# Patient Record
Sex: Male | Born: 1955 | Race: White | Hispanic: No | Smoking: Never smoker
Health system: Southern US, Community
[De-identification: ages and names within clinical notes are randomized; demographics above are authoritative.]

## PROBLEM LIST (undated history)

## (undated) DIAGNOSIS — K2270 Barrett's esophagus without dysplasia: Secondary | ICD-10-CM

## (undated) DIAGNOSIS — G4730 Sleep apnea, unspecified: Secondary | ICD-10-CM

## (undated) DIAGNOSIS — C439 Malignant melanoma of skin, unspecified: Secondary | ICD-10-CM

## (undated) DIAGNOSIS — I4891 Unspecified atrial fibrillation: Secondary | ICD-10-CM

## (undated) DIAGNOSIS — I10 Essential (primary) hypertension: Secondary | ICD-10-CM

## (undated) DIAGNOSIS — I509 Heart failure, unspecified: Secondary | ICD-10-CM

## (undated) HISTORY — PX: OTHER SURGICAL HISTORY: SHX169

## (undated) HISTORY — PX: BACK SURGERY: SHX140

## (undated) HISTORY — PX: APPENDECTOMY: SHX54

---

## 1998-09-25 ENCOUNTER — Ambulatory Visit: Admission: RE | Admit: 1998-09-25 | Discharge: 1998-09-25 | Payer: Self-pay | Admitting: *Deleted

## 1998-09-25 ENCOUNTER — Encounter: Payer: Self-pay | Admitting: *Deleted

## 2001-11-08 ENCOUNTER — Encounter: Admission: RE | Admit: 2001-11-08 | Discharge: 2001-12-20 | Payer: Self-pay | Admitting: Occupational Medicine

## 2004-06-01 ENCOUNTER — Ambulatory Visit (HOSPITAL_BASED_OUTPATIENT_CLINIC_OR_DEPARTMENT_OTHER): Admission: RE | Admit: 2004-06-01 | Discharge: 2004-06-01 | Payer: Self-pay | Admitting: Cardiology

## 2004-06-12 ENCOUNTER — Ambulatory Visit (HOSPITAL_BASED_OUTPATIENT_CLINIC_OR_DEPARTMENT_OTHER): Admission: RE | Admit: 2004-06-12 | Discharge: 2004-06-12 | Payer: Self-pay | Admitting: Cardiology

## 2006-06-18 ENCOUNTER — Emergency Department (HOSPITAL_COMMUNITY): Admission: EM | Admit: 2006-06-18 | Discharge: 2006-06-18 | Payer: Self-pay | Admitting: Emergency Medicine

## 2006-09-26 ENCOUNTER — Emergency Department (HOSPITAL_COMMUNITY): Admission: EM | Admit: 2006-09-26 | Discharge: 2006-09-27 | Payer: Self-pay | Admitting: Emergency Medicine

## 2006-12-28 ENCOUNTER — Ambulatory Visit (HOSPITAL_COMMUNITY): Admission: RE | Admit: 2006-12-28 | Discharge: 2006-12-28 | Payer: Self-pay | Admitting: Cardiology

## 2007-11-17 ENCOUNTER — Ambulatory Visit: Payer: Self-pay | Admitting: Internal Medicine

## 2007-11-25 ENCOUNTER — Encounter: Payer: Self-pay | Admitting: Internal Medicine

## 2007-11-25 ENCOUNTER — Other Ambulatory Visit: Payer: Self-pay | Admitting: Internal Medicine

## 2007-11-25 ENCOUNTER — Ambulatory Visit: Payer: Self-pay | Admitting: Internal Medicine

## 2007-11-25 ENCOUNTER — Inpatient Hospital Stay (HOSPITAL_COMMUNITY): Admission: AD | Admit: 2007-11-25 | Discharge: 2007-11-26 | Payer: Self-pay | Admitting: Internal Medicine

## 2007-12-15 ENCOUNTER — Ambulatory Visit: Payer: Self-pay | Admitting: Cardiology

## 2008-01-12 ENCOUNTER — Ambulatory Visit (HOSPITAL_COMMUNITY): Admission: RE | Admit: 2008-01-12 | Discharge: 2008-01-13 | Payer: Self-pay | Admitting: Orthopedic Surgery

## 2008-01-29 ENCOUNTER — Inpatient Hospital Stay (HOSPITAL_COMMUNITY): Admission: EM | Admit: 2008-01-29 | Discharge: 2008-01-31 | Payer: Self-pay | Admitting: Emergency Medicine

## 2008-03-21 ENCOUNTER — Ambulatory Visit (HOSPITAL_COMMUNITY): Admission: RE | Admit: 2008-03-21 | Discharge: 2008-03-22 | Payer: Self-pay | Admitting: Orthopedic Surgery

## 2008-08-06 DIAGNOSIS — H9190 Unspecified hearing loss, unspecified ear: Secondary | ICD-10-CM | POA: Insufficient documentation

## 2008-08-06 DIAGNOSIS — I10 Essential (primary) hypertension: Secondary | ICD-10-CM | POA: Insufficient documentation

## 2008-08-06 DIAGNOSIS — I4892 Unspecified atrial flutter: Secondary | ICD-10-CM

## 2008-08-06 DIAGNOSIS — I2510 Atherosclerotic heart disease of native coronary artery without angina pectoris: Secondary | ICD-10-CM | POA: Insufficient documentation

## 2008-08-06 DIAGNOSIS — M109 Gout, unspecified: Secondary | ICD-10-CM | POA: Insufficient documentation

## 2008-08-06 DIAGNOSIS — I5032 Chronic diastolic (congestive) heart failure: Secondary | ICD-10-CM | POA: Insufficient documentation

## 2008-08-06 DIAGNOSIS — G4730 Sleep apnea, unspecified: Secondary | ICD-10-CM

## 2008-08-06 DIAGNOSIS — K219 Gastro-esophageal reflux disease without esophagitis: Secondary | ICD-10-CM | POA: Insufficient documentation

## 2008-08-06 DIAGNOSIS — G4733 Obstructive sleep apnea (adult) (pediatric): Secondary | ICD-10-CM | POA: Insufficient documentation

## 2008-09-19 ENCOUNTER — Ambulatory Visit: Payer: Self-pay | Admitting: Internal Medicine

## 2008-09-19 ENCOUNTER — Inpatient Hospital Stay (HOSPITAL_COMMUNITY): Admission: EM | Admit: 2008-09-19 | Discharge: 2008-09-24 | Payer: Self-pay | Admitting: Emergency Medicine

## 2008-09-24 ENCOUNTER — Encounter: Payer: Self-pay | Admitting: Internal Medicine

## 2009-02-27 ENCOUNTER — Telehealth (INDEPENDENT_AMBULATORY_CARE_PROVIDER_SITE_OTHER): Payer: Self-pay | Admitting: *Deleted

## 2009-03-22 ENCOUNTER — Emergency Department (HOSPITAL_COMMUNITY): Admission: EM | Admit: 2009-03-22 | Discharge: 2009-03-23 | Payer: Self-pay | Admitting: Emergency Medicine

## 2009-05-23 ENCOUNTER — Telehealth (INDEPENDENT_AMBULATORY_CARE_PROVIDER_SITE_OTHER): Payer: Self-pay | Admitting: *Deleted

## 2009-08-05 ENCOUNTER — Encounter: Payer: Self-pay | Admitting: Surgery

## 2009-08-05 ENCOUNTER — Ambulatory Visit (HOSPITAL_COMMUNITY): Admission: RE | Admit: 2009-08-05 | Discharge: 2009-08-05 | Payer: Self-pay | Admitting: Surgery

## 2009-11-05 ENCOUNTER — Inpatient Hospital Stay (HOSPITAL_COMMUNITY): Admission: EM | Admit: 2009-11-05 | Discharge: 2009-11-12 | Payer: Self-pay | Admitting: Emergency Medicine

## 2009-11-05 ENCOUNTER — Ambulatory Visit: Payer: Self-pay | Admitting: Internal Medicine

## 2009-11-11 ENCOUNTER — Encounter: Payer: Self-pay | Admitting: Internal Medicine

## 2009-11-14 ENCOUNTER — Telehealth: Payer: Self-pay | Admitting: Internal Medicine

## 2009-11-14 ENCOUNTER — Telehealth (INDEPENDENT_AMBULATORY_CARE_PROVIDER_SITE_OTHER): Payer: Self-pay | Admitting: Cardiology

## 2009-11-25 ENCOUNTER — Encounter: Admission: RE | Admit: 2009-11-25 | Discharge: 2009-11-25 | Payer: Self-pay | Admitting: Surgery

## 2009-12-24 ENCOUNTER — Encounter (INDEPENDENT_AMBULATORY_CARE_PROVIDER_SITE_OTHER): Payer: Self-pay | Admitting: *Deleted

## 2010-09-30 NOTE — Progress Notes (Signed)
Summary: pt refuse home health  Phone Note From Other Clinic Call back at [phone]   Caller: Alfredia Client warren with [facility] Request: Talk with Nurse, Talk with Provider Summary of Call: just wanted to let us know pt refused sevices from home health Initial call taken by: Omer Jack,  November 14, 2009 11:30 AM

## 2010-09-30 NOTE — Letter (Signed)
Summary: Appointment - Missed  [facility], Main Office  [address]   [place], [place] [address]   Phone: [phone]  Fax: [phone]     December 24, 2009 MRN: [id number]   Eddie Davis [address] [place], [place]  [address]   Dear Mr. Risden,  Our records indicate you missed your appointment on 12/03/09 with Dr. Graciela Husbands. It is very important that we reach you to reschedule this appointment. We look forward to participating in your health care needs. Please contact us at the number listed above at your earliest convenience to reschedule this appointment.     Sincerely,   Ruel Favors Scheduling Team

## 2010-09-30 NOTE — Progress Notes (Signed)
Summary: Question about coumadin  Phone Note Call from Patient Call back at [phone]   Caller: Sister/Chianti Goh Summary of Call: Question about coumadin Initial call taken by: Judie Grieve,  November 14, 2009 12:53 PM  Follow-up for Phone Call        Called spoke with pt's sister Corrie Dandy.  Pt weighs over 400 lbs and is currently suffering from gout and at the present the family is unable to get pt physically out of the house and to [facility] for a coumadin clinic appt.  Pt has been on coumadin for well over a year and has previously been monitored in [place] at [facility]. by Roxanne Mins, PA-C.  Pt has refused HH, he doesn't have insurance and is unable to afford [facility] visits.  Appt cancelled for tomorrow and refused r/s at this time.  Unsure when pt will be able to get out of house.  Please advise.  Thanks. Follow-up by: Cloyde Reams RN,  November 14, 2009 3:35 PM  Additional Follow-up for Phone Call Additional follow up Details #1::        if he cant get checked then we should probably stop the coumadin Additional Follow-up by: Nathen May, MD, [facility],  November 19, 2009 5:36 PM    Additional Follow-up for Phone Call Additional follow up Details #2::    Spoke with pt, advised if he is unable to come into office to have his coumadin checked, then he will have to discontinue his coumadin therapy, due to high risks of bleeding and even death associated with non-compliance of monitoring INR's.  Pt insists he has gone for 6 mos + in the past without being monitored and I explained to pt that we were not previously monitoring pt's coumadin, but it is not our practice to rx pt's coumadin without appropriate monitoring every 4 weeks at least.  Pt states he is unable to come in, but he is going to his lawyer's office on Friday 11/29/09 at 9am and he could possibly come by after appt, but would need wheelchair assistance to get upstairs to appt.  Advised pt he must make appt, pt insists he is not going to wait  around and does not know when he will be done at the lawyer's office.  Advised pt we could make appt for 12:00 and if he got here sooner we would try and work him in as soon as possible.  Pt advised that [facility] has lunch from 12:45-1:45 and no one available to check his coumadin during these times. Pt agreed to schedule OV on 11/29/09 at 12:00.  Appt scheduled. Follow-up by: Cloyde Reams RN,  November 27, 2009 4:22 PM

## 2010-11-23 LAB — HEPARIN LEVEL (UNFRACTIONATED)
Heparin Unfractionated: 0.1 IU/mL — ABNORMAL LOW (ref 0.30–0.70)
Heparin Unfractionated: 0.16 IU/mL — ABNORMAL LOW (ref 0.30–0.70)
Heparin Unfractionated: 0.18 IU/mL — ABNORMAL LOW (ref 0.30–0.70)
Heparin Unfractionated: 0.34 IU/mL (ref 0.30–0.70)
Heparin Unfractionated: <0.1 IU/mL — ABNORMAL LOW (ref 0.30–0.70)

## 2010-11-23 LAB — BASIC METABOLIC PANEL
BUN: 20 mg/dL (ref 6–23)
BUN: 20 mg/dL (ref 6–23)
BUN: 21 mg/dL (ref 6–23)
BUN: 22 mg/dL (ref 6–23)
CO2: 33 mEq/L — ABNORMAL HIGH (ref 19–32)
CO2: 35 mEq/L — ABNORMAL HIGH (ref 19–32)
CO2: 35 mEq/L — ABNORMAL HIGH (ref 19–32)
CO2: 37 mEq/L — ABNORMAL HIGH (ref 19–32)
Calcium: 8.1 mg/dL — ABNORMAL LOW (ref 8.4–10.5)
Calcium: 8.3 mg/dL — ABNORMAL LOW (ref 8.4–10.5)
Calcium: 8.4 mg/dL (ref 8.4–10.5)
Calcium: 8.8 mg/dL (ref 8.4–10.5)
Chloride: 108 mEq/L (ref 96–112)
Chloride: 96 mEq/L (ref 96–112)
Creatinine, Ser: 1.37 mg/dL (ref 0.4–1.5)
Creatinine, Ser: 1.53 mg/dL — ABNORMAL HIGH (ref 0.4–1.5)
Creatinine, Ser: 1.58 mg/dL — ABNORMAL HIGH (ref 0.4–1.5)
Creatinine, Ser: 1.67 mg/dL — ABNORMAL HIGH (ref 0.4–1.5)
GFR calc Af Amer: 52 mL/min — ABNORMAL LOW (ref >60)
GFR calc Af Amer: 56 mL/min — ABNORMAL LOW (ref >60)
GFR calc Af Amer: 58 mL/min — ABNORMAL LOW (ref >60)
GFR calc Af Amer: >60 mL/min (ref >60)
GFR calc non Af Amer: 43 mL/min — ABNORMAL LOW (ref >60)
GFR calc non Af Amer: 46 mL/min — ABNORMAL LOW (ref >60)
GFR calc non Af Amer: 48 mL/min — ABNORMAL LOW (ref >60)
Glucose, Bld: 125 mg/dL — ABNORMAL HIGH (ref 70–99)
Glucose, Bld: 127 mg/dL — ABNORMAL HIGH (ref 70–99)
Glucose, Bld: 146 mg/dL — ABNORMAL HIGH (ref 70–99)
Potassium: 3.9 mEq/L (ref 3.5–5.1)
Sodium: 139 mEq/L (ref 135–145)
Sodium: 141 mEq/L (ref 135–145)
Sodium: 143 mEq/L (ref 135–145)

## 2010-11-23 LAB — CBC
HCT: 36.9 % — ABNORMAL LOW (ref 39.0–52.0)
HCT: 37.3 % — ABNORMAL LOW (ref 39.0–52.0)
HCT: 38.5 % — ABNORMAL LOW (ref 39.0–52.0)
Hemoglobin: 11.2 g/dL — ABNORMAL LOW (ref 13.0–17.0)
Hemoglobin: 12.2 g/dL — ABNORMAL LOW (ref 13.0–17.0)
Hemoglobin: 12.3 g/dL — ABNORMAL LOW (ref 13.0–17.0)
MCHC: 32.2 g/dL (ref 30.0–36.0)
MCHC: 32.4 g/dL (ref 30.0–36.0)
MCHC: 32.6 g/dL (ref 30.0–36.0)
MCHC: 32.7 g/dL (ref 30.0–36.0)
MCV: 86.5 fL (ref 78.0–100.0)
MCV: 87 fL (ref 78.0–100.0)
MCV: 87.1 fL (ref 78.0–100.0)
MCV: 87.3 fL (ref 78.0–100.0)
Platelets: 150 10*3/uL (ref 150–400)
Platelets: 167 10*3/uL (ref 150–400)
Platelets: 181 10*3/uL (ref 150–400)
Platelets: 191 10*3/uL (ref 150–400)
RBC: 3.98 MIL/uL — ABNORMAL LOW (ref 4.22–5.81)
RBC: 4.15 MIL/uL — ABNORMAL LOW (ref 4.22–5.81)
RBC: 4.4 MIL/uL (ref 4.22–5.81)
RDW: 17.3 % — ABNORMAL HIGH (ref 11.5–15.5)
RDW: 17.6 % — ABNORMAL HIGH (ref 11.5–15.5)
RDW: 17.7 % — ABNORMAL HIGH (ref 11.5–15.5)
RDW: 17.8 % — ABNORMAL HIGH (ref 11.5–15.5)
WBC: 12 10*3/uL — ABNORMAL HIGH (ref 4.0–10.5)
WBC: 13.6 10*3/uL — ABNORMAL HIGH (ref 4.0–10.5)
WBC: 14.5 10*3/uL — ABNORMAL HIGH (ref 4.0–10.5)

## 2010-11-23 LAB — COMPREHENSIVE METABOLIC PANEL
ALT: 20 U/L (ref 0–53)
AST: 24 U/L (ref 0–37)
CO2: 27 mEq/L (ref 19–32)
Calcium: 8.2 mg/dL — ABNORMAL LOW (ref 8.4–10.5)
Chloride: 105 mEq/L (ref 96–112)
Creatinine, Ser: 1.6 mg/dL — ABNORMAL HIGH (ref 0.4–1.5)
GFR calc Af Amer: 55 mL/min — ABNORMAL LOW (ref >60)
GFR calc non Af Amer: 45 mL/min — ABNORMAL LOW (ref >60)
Glucose, Bld: 116 mg/dL — ABNORMAL HIGH (ref 70–99)
Sodium: 142 mEq/L (ref 135–145)
Total Bilirubin: 1.9 mg/dL — ABNORMAL HIGH (ref 0.3–1.2)

## 2010-11-23 LAB — PROTIME-INR
INR: 1.28 (ref 0.00–1.49)
INR: 1.41 (ref 0.00–1.49)
INR: 1.43 (ref 0.00–1.49)
INR: 1.48 (ref 0.00–1.49)
Prothrombin Time: 15.9 seconds — ABNORMAL HIGH (ref 11.6–15.2)
Prothrombin Time: 17.1 seconds — ABNORMAL HIGH (ref 11.6–15.2)
Prothrombin Time: 17.3 seconds — ABNORMAL HIGH (ref 11.6–15.2)
Prothrombin Time: 24.1 seconds — ABNORMAL HIGH (ref 11.6–15.2)

## 2010-11-23 LAB — BRAIN NATRIURETIC PEPTIDE: Pro B Natriuretic peptide (BNP): 769 pg/mL — ABNORMAL HIGH (ref 0.0–100.0)

## 2010-11-23 LAB — LIPID PANEL
Triglycerides: 95 mg/dL (ref <150)
VLDL: 19 mg/dL (ref 0–40)

## 2010-11-23 LAB — DIFFERENTIAL
Lymphocytes Relative: 16 % (ref 12–46)
Lymphs Abs: 2.1 10*3/uL (ref 0.7–4.0)
Monocytes Relative: 9 % (ref 3–12)
Neutro Abs: 10.2 10*3/uL — ABNORMAL HIGH (ref 1.7–7.7)
Neutrophils Relative %: 75 % (ref 43–77)

## 2010-11-23 LAB — POCT CARDIAC MARKERS
CKMB, poc: 2.7 ng/mL (ref 1.0–8.0)
Myoglobin, poc: 424 ng/mL (ref 12–200)

## 2010-12-07 LAB — BLOOD GAS, VENOUS
Acid-Base Excess: 1.6 mmol/L (ref 0.0–2.0)
TCO2: 24.6 mmol/L (ref 0–100)
pCO2, Ven: 47.2 mmHg (ref 45.0–50.0)
pO2, Ven: 37.8 mmHg (ref 30.0–45.0)

## 2010-12-07 LAB — DIFFERENTIAL
Eosinophils Absolute: 0.1 10*3/uL (ref 0.0–0.7)
Eosinophils Relative: 2 % (ref 0–5)
Lymphs Abs: 2.6 10*3/uL (ref 0.7–4.0)
Monocytes Relative: 6 % (ref 3–12)

## 2010-12-07 LAB — PROTIME-INR
INR: 1 (ref 0.00–1.49)
Prothrombin Time: 13.5 seconds (ref 11.6–15.2)

## 2010-12-07 LAB — CBC
HCT: 37.7 % — ABNORMAL LOW (ref 39.0–52.0)
MCV: 89.8 fL (ref 78.0–100.0)
RBC: 4.19 MIL/uL — ABNORMAL LOW (ref 4.22–5.81)
WBC: 7.7 10*3/uL (ref 4.0–10.5)

## 2010-12-07 LAB — POCT I-STAT, CHEM 8
BUN: 13 mg/dL (ref 6–23)
Creatinine, Ser: 1.4 mg/dL (ref 0.4–1.5)
Hemoglobin: 13.3 g/dL (ref 13.0–17.0)
Potassium: 3.6 mEq/L (ref 3.5–5.1)
Sodium: 143 mEq/L (ref 135–145)

## 2010-12-07 LAB — POCT CARDIAC MARKERS
CKMB, poc: 2.2 ng/mL (ref 1.0–8.0)
Myoglobin, poc: 180 ng/mL (ref 12–200)

## 2010-12-15 LAB — CBC
HCT: 38.1 % — ABNORMAL LOW (ref 39.0–52.0)
HCT: 40 % (ref 39.0–52.0)
HCT: 40.3 % (ref 39.0–52.0)
HCT: 42.2 % (ref 39.0–52.0)
Hemoglobin: 13.4 g/dL (ref 13.0–17.0)
MCHC: 31.6 g/dL (ref 30.0–36.0)
MCHC: 31.7 g/dL (ref 30.0–36.0)
MCHC: 32.4 g/dL (ref 30.0–36.0)
MCV: 81.5 fL (ref 78.0–100.0)
MCV: 82.7 fL (ref 78.0–100.0)
MCV: 82.8 fL (ref 78.0–100.0)
Platelets: 210 10*3/uL (ref 150–400)
Platelets: 223 10*3/uL (ref 150–400)
Platelets: 243 10*3/uL (ref 150–400)
Platelets: 264 10*3/uL (ref 150–400)
RBC: 4.91 MIL/uL (ref 4.22–5.81)
RBC: 5.09 MIL/uL (ref 4.22–5.81)
RDW: 19.6 % — ABNORMAL HIGH (ref 11.5–15.5)
WBC: 10.1 10*3/uL (ref 4.0–10.5)
WBC: 10.7 10*3/uL — ABNORMAL HIGH (ref 4.0–10.5)
WBC: 18.1 10*3/uL — ABNORMAL HIGH (ref 4.0–10.5)
WBC: 9.8 10*3/uL (ref 4.0–10.5)

## 2010-12-15 LAB — BASIC METABOLIC PANEL
BUN: 23 mg/dL (ref 6–23)
BUN: 25 mg/dL — ABNORMAL HIGH (ref 6–23)
BUN: 30 mg/dL — ABNORMAL HIGH (ref 6–23)
BUN: 34 mg/dL — ABNORMAL HIGH (ref 6–23)
CO2: 30 mEq/L (ref 19–32)
CO2: 32 mEq/L (ref 19–32)
CO2: 32 mEq/L (ref 19–32)
Calcium: 8.6 mg/dL (ref 8.4–10.5)
Calcium: 9 mg/dL (ref 8.4–10.5)
Chloride: 101 mEq/L (ref 96–112)
Chloride: 99 mEq/L (ref 96–112)
Creatinine, Ser: 1.59 mg/dL — ABNORMAL HIGH (ref 0.4–1.5)
Creatinine, Ser: 1.62 mg/dL — ABNORMAL HIGH (ref 0.4–1.5)
Creatinine, Ser: 1.86 mg/dL — ABNORMAL HIGH (ref 0.4–1.5)
GFR calc Af Amer: 46 mL/min — ABNORMAL LOW (ref >60)
GFR calc non Af Amer: 45 mL/min — ABNORMAL LOW (ref >60)
GFR calc non Af Amer: 45 mL/min — ABNORMAL LOW (ref >60)
GFR calc non Af Amer: 46 mL/min — ABNORMAL LOW (ref >60)
Glucose, Bld: 151 mg/dL — ABNORMAL HIGH (ref 70–99)
Potassium: 4.1 mEq/L (ref 3.5–5.1)
Potassium: 4.5 mEq/L (ref 3.5–5.1)
Potassium: 4.5 mEq/L (ref 3.5–5.1)
Sodium: 138 mEq/L (ref 135–145)
Sodium: 141 mEq/L (ref 135–145)
Sodium: 142 mEq/L (ref 135–145)

## 2010-12-15 LAB — CARDIAC PANEL(CRET KIN+CKTOT+MB+TROPI)
Relative Index: 3.2 — ABNORMAL HIGH (ref 0.0–2.5)
Troponin I: 0.01 ng/mL (ref 0.00–0.06)
Troponin I: 0.01 ng/mL (ref 0.00–0.06)

## 2010-12-15 LAB — SYNOVIAL CELL COUNT + DIFF, W/ CRYSTALS
Crystals, Fluid: NONE SEEN
Lymphocytes-Synovial Fld: 1 % (ref 0–20)
Monocyte-Macrophage-Synovial Fluid: 6 % — ABNORMAL LOW (ref 50–90)
Neutrophil, Synovial: 93 % — ABNORMAL HIGH (ref 0–25)
WBC, Synovial: 45320 /mm3 — ABNORMAL HIGH (ref 0–200)

## 2010-12-15 LAB — HEPARIN LEVEL (UNFRACTIONATED)
Heparin Unfractionated: 0.18 IU/mL — ABNORMAL LOW (ref 0.30–0.70)
Heparin Unfractionated: 0.23 IU/mL — ABNORMAL LOW (ref 0.30–0.70)
Heparin Unfractionated: 0.27 IU/mL — ABNORMAL LOW (ref 0.30–0.70)
Heparin Unfractionated: 0.46 IU/mL (ref 0.30–0.70)
Heparin Unfractionated: 0.76 IU/mL — ABNORMAL HIGH (ref 0.30–0.70)

## 2010-12-15 LAB — GRAM STAIN

## 2010-12-15 LAB — COMPREHENSIVE METABOLIC PANEL
Albumin: 3.2 g/dL — ABNORMAL LOW (ref 3.5–5.2)
BUN: 30 mg/dL — ABNORMAL HIGH (ref 6–23)
CO2: 28 mEq/L (ref 19–32)
Chloride: 108 mEq/L (ref 96–112)
Creatinine, Ser: 1.45 mg/dL (ref 0.4–1.5)
GFR calc non Af Amer: 51 mL/min — ABNORMAL LOW (ref >60)
Total Bilirubin: 0.7 mg/dL (ref 0.3–1.2)

## 2010-12-15 LAB — GLUCOSE, CAPILLARY
Glucose-Capillary: 113 mg/dL — ABNORMAL HIGH (ref 70–99)
Glucose-Capillary: 161 mg/dL — ABNORMAL HIGH (ref 70–99)
Glucose-Capillary: 254 mg/dL — ABNORMAL HIGH (ref 70–99)
Glucose-Capillary: 79 mg/dL (ref 70–99)
Glucose-Capillary: 98 mg/dL (ref 70–99)

## 2010-12-15 LAB — PROTIME-INR
INR: 1.1 (ref 0.00–1.49)
INR: 1.1 (ref 0.00–1.49)
INR: 1.2 (ref 0.00–1.49)
INR: 1.8 — ABNORMAL HIGH (ref 0.00–1.49)
INR: 1.8 — ABNORMAL HIGH (ref 0.00–1.49)
INR: 2.1 — ABNORMAL HIGH (ref 0.00–1.49)
Prothrombin Time: 15 seconds (ref 11.6–15.2)
Prothrombin Time: 15 seconds (ref 11.6–15.2)
Prothrombin Time: 15.3 seconds — ABNORMAL HIGH (ref 11.6–15.2)
Prothrombin Time: 18.9 seconds — ABNORMAL HIGH (ref 11.6–15.2)

## 2010-12-15 LAB — DIFFERENTIAL
Basophils Absolute: 0.1 10*3/uL (ref 0.0–0.1)
Lymphocytes Relative: 26 % (ref 12–46)
Neutro Abs: 6.8 10*3/uL (ref 1.7–7.7)

## 2010-12-15 LAB — BRAIN NATRIURETIC PEPTIDE
Pro B Natriuretic peptide (BNP): 387 pg/mL — ABNORMAL HIGH (ref 0.0–100.0)
Pro B Natriuretic peptide (BNP): 481 pg/mL — ABNORMAL HIGH (ref 0.0–100.0)

## 2010-12-15 LAB — BODY FLUID CULTURE

## 2010-12-15 LAB — APTT: aPTT: 26 seconds (ref 24–37)

## 2011-01-13 NOTE — Discharge Summary (Signed)
NAME:  Eddie, Davis NO.:  [id number]   MEDICAL RECORD NO.:  [id number]           PATIENT TYPE:   LOCATION:                                 FACILITY:   PHYSICIAN:  Pricilla Riffle, MD, FACCDATE OF BIRTH:  [birth year]   DATE OF ADMISSION:  DATE OF DISCHARGE:                               DISCHARGE SUMMARY   IDENTIFICATION:  The patient is a 55 year old with atrial fibrillation,  currently on amiodarone, planned for cardioversion.  TE negative for  clot.   The patient sedated for anesthesia with 170 mg propofol IV.   With the pads in the AP position, it was attempted to cardiovert the  patient with 200 joules synchronized biphasic energy, this failed with  added pressure to the anterior chest.  The patient was again shocked  with 200 joules synchronized biphasic energy.  This was unsuccessful.   The pads were removed to the apex base position.  There was pressure  placed at the base.  Again, the patient was shocked with 200 joules  biphasic synchronized energy.  This time, he cardioverted to sinus  bradycardia.   PROCEDURE:  Without complications.  A 12-lead EKG pending.      Pricilla Riffle, MD, [facility]  Electronically Signed     PVR/MEDQ  D:  09/24/2008  T:  09/25/2008  Job:  [id number]

## 2011-01-13 NOTE — Discharge Summary (Signed)
NAMEGENE, GLAZEBROOK NO.:  [id number]   MEDICAL RECORD NO.:  [id number]          PATIENT TYPE:  INP   LOCATION:  2008                         FACILITY:  [facility]   PHYSICIAN:  Duke Salvia, MD, FACCDATE OF BIRTH:  12/04/1955   DATE OF ADMISSION:  11/25/2007  DATE OF DISCHARGE:  11/26/2007                               DISCHARGE SUMMARY   He has an allergy to HYDROCHLOROTHIAZIDE.   Dictation greater than 35 minutes.   FINAL DIAGNOSES:  1. Discharging day 1, status post transesophageal echocardiogram,      ejection fraction about 45%. He has a left atrial appendage      thrombus.  2. Discharging day 1, status post electrophysiology study,      radiofrequency catheter ablation of a counterclockwise, typical      atrial flutter with cavotricuspid isthmus, ablation of      bidirectional block, Dr. Sherryl Manges.  3. New diagnosis, atrial flutter.      a.     Symptoms, weakness, dyspnea, palpitation.  4. Discharging on Lovenox to bridge to therapeutic Coumadin.   SECONDARY DIAGNOSES:  1. Morbid obesity.  2. Hypertension.  3. Diastolic dysfunction.  4. Left heart catheterization in April 2008, nonobstructive coronary      artery disease, left ventricle normal.  5. Obstructive sleep apnea.  6. Gastroesophageal reflux disease.  7. Gout.   PROCEDURE:  1. November 25, 2007, transesophageal echocardiogram as above.  2. November 25, 2007, electrophysiology study, radiofrequency catheter      ablation of typical atrial flutter by Dr. Sherryl Manges.  The      patient is doing well after the catheterization.  He has had no      hematomas in either groin.  He is maintaining sinus rhythm.  He has      been hypertensive throughout this admission, but we will leave      decisions as to adjustment of blood pressure medications to Dr.      Landry Mellow.   DISCHARGE MEDICATIONS:  1. Bystolic 10 mg daily.  2. Hydralazine 25 mg daily.  3. Cardia 240 mg daily.  4. Benicar 40 mg  daily.  5. Lasix 20 mg daily.  6. Nexium 40 mg daily.  7. Coumadin 10 mg daily.  8. Simvastatin 40 mg two times daily.  9. Lovenox 150 mg prefilled syringes.   He has an office visit with Dr. Irish Lack office on Tuesday, November 29, 2007, for protime, and he will see Dr. Graciela Husbands in 4 weeks.  Dr. Odessa Fleming  office will call for that appointment the week of December 19, 2007.   LABORATORY STUDIES:  Pertinent to this admission on the day of  discharge, his INR was 1.1 in November 26, 2007.  Laboratory studies  pertinent to this admission day of admission, sodium 139, potassium 3.6,  chloride 105, carbonate 28, glucose 98, BUN is 13, and creatinine 1.9.  Hemoglobin A1c is 5.6.      Maple Mirza, [place]      Duke Salvia, MD, [facility]  Electronically Signed    GM/MEDQ  D:  12/22/2007  T:  12/23/2007  Job:  [id number]   cc:   Duke Salvia, MD, [facility]  Patrina Levering

## 2011-01-13 NOTE — Discharge Summary (Signed)
Eddie Davis, Eddie Davis NO.:  [id number]   MEDICAL RECORD NO.:  [id number]          PATIENT TYPE:  INP   LOCATION:  2923                         FACILITY:  [facility]   PHYSICIAN:  Eddie Salvia, MD, FACCDATE OF BIRTH:  December 16, 1955   DATE OF ADMISSION:  09/19/2008  DATE OF DISCHARGE:                               DISCHARGE SUMMARY   This patient has allergies to HYDROCHLOROTHIAZIDE and AZOR admitted  September 19, 2008, and discharged on September 24, 2008.   FINAL DIAGNOSES:  1. Admitted with volume overload/orthopnea/nocturnal dyspnea.  2. Acute on chronic New [place] Heart Association class II-III congestive      heart failure (mostly diastolic component).  3. New-onset atrial fibrillation rapid ventricular rate.      a.     Atrial fibrillation aggravates/feeding on acute congestive       heart failure.  4. Amiodarone load this admission.  5. Started Coumadin this admission.  6. Transesophageal echocardiogram on September 24, 2008, no left atrial      appendage thrombus.  7. Direct-current cardioversion with sinus bradycardia this admission.      a.     Three separate geometries at 200 joules were used - apex to       base finally effective - to eventually cardiovert the patient has       a sinus bradycardia.  He is maintaining sinus bradycardia at       discharge.  8. Medical adjustments secondary to sinus bradycardia on DCCV.  9. Troponin I negative x3 this admission.  10.Flare of gout, both knees this admission.  11.Colchicine 0.6 mg b.i.d. at discharge.   SECONDARY DIAGNOSES:  1. History of atrial flutter, status post cavotricuspid isthmus      ablation, November 25, 2007.  2. Catheterization, April 2008, ejection fraction 50-60%,      nonobstructive coronary artery disease.  3. Transesophageal echocardiogram, November 25, 2007, ejection fraction      50%, no left atrial appendage thrombus.  4. Obstructive sleep apnea/CPAP.  5. Hypertension.  6. Obesity.  7.  History of multiple back surgeries, the last one was in May 2009      with seroma formation requiring decomposition in July 2009.   PROCEDURES:  1. Orthopedics directed aspiration of the right knee, September 21, 2008, for a right knee effusion.  Culture was negative for      organisms, negative for crystals.  The patient will have followup      with Orthopedics outpatient.  2. September 24, 2008, transesophageal echocardiogram, no left atrial      appendage thrombus followed by direct current cardioversion to      sinus bradycardia.  The patient has been previously loaded with      amiodarone and will maintain sinus bradycardia.   BRIEF HISTORY:  Eddie Davis is a 55 year old male.  He presents with a  massive fluid overload.  This is aggravated by atrial fibrillation,  rapid ventricular response.  His atrial fibrillation is new in onset.   The patient has a history of atypical chest pain.  He underwent  catheterization in 2008.  The study showed nonobstructive coronary  artery disease with normal left ventricular function.  In the spring of  2009, he was found to have atrial flutter.  He subsequently underwent  catheter ablation.  This was successful in restoring sinus rhythm.  At  the same time, he had ultrasounds, which showed modest depression of  left ventricular function to 45%.  It was hoped that this would improve  following ablation.   His comorbid conditions include obstructive sleep apnea, hypertension,  and obesity.   The patient has had two major back surgeries, the most recent one in May  2009.  There was a seroma, which found postoperatively and this required  decompensation in a separate procedure in July 2009.   The patient has been experiencing progressive congestive heart failure  symptoms.  They are manifested by shortness of breath even at rest,  nocturnal dyspnea, and orthopnea.  His atrial fibrillation may well be  contributing to this.  He will be admitted  to [facility] for  diuresis for symptom release as well as rate control and possible  antiarrhythmic therapy and cardioversion to return to sinus rhythm.   HOSPITAL COURSE:  The patient presents on September 19, 2008, with volume  overload.  He underwent aggressive IV Lasix diuresis through most of  this hospitalization shifting back to oral furosemide only in the last  24 hours prior to discharge.  At the same time, he was undergoing  diuresis.  He was started on IV amiodarone and then after significant IV  load, switched to oral amiodarone loading therapy.  He is also started  on Coumadin with IV heparin.  When the patient was judged to be  significantly loaded with amiodarone, he underwent transesophageal  echocardiogram and direct current cardioversion to sinus bradycardia  with the echocardiogram showed there was no left atrial appendage  thrombus.  Cardioversion procedure required three separate geometries to  affect conversion.  The apex of the base was the geometry at 200 joules  which worked to convert the patient.   He also had a flare of gout in both knees.  The patient says that this  always happens when he has IV Lasix diuresis.  The right knee was  particularly swollen and required orthopedic directed aspiration, 45 mL  of cloudy fluid was removed.  Subsequent culture showed no organisms and  there were no crystals.  The patient did not require cortisone  injection, but he did receive oral prednisone therapy.  The patient is  discharging on September 24, 2008, on the following medications.  1. Amiodarone 200 mg tablets, 2 tablets in the morning, 2 tablets in      the evening from Tuesday, September 25, 2008, to October 09, 2008;      then 1 tablet in the morning and 1 tablet in the evening from      October 10, 2008, to October 24, 2008, and then he is to take      only 1 tablet daily starting Thursday, October 25, 2008.  2. Coumadin 10 mg tablets and 5 mg tablets.   As of discharge today, he      is to take one 10-mg tablet and one-half of a 5-mg tablet for a      12.5 mg daily dose.  He will continue this until he sees Dr.      Era Skeen office on Thursday, September 27, 2008.  3. Furosemide 20 mg tablets 3  tablets each morning.  He will determine      if he requires higher doses of this.  At [facility], he      has been on 80 mg IV q.12 h.  He may well require 80 mg in the      morning and 40 mg in the evening.  This will be decided at office      visit with Dr. Elizebeth Koller.  4. Bystolic 10 mg daily.  This is a new dose down from 30 mg daily.  5. Benicar 20 mg daily.  A new dose down from 40 mg daily.  6. Simvastatin 80 mg daily at bedtime.  7. Nexium 40 mg daily.  8. Colchicine 0.6 mg twice daily.  9. Over-the-counter potassium daily.   The patient is asked to stop taking Maxzide and to stop taking  hydralazine.  Of note, the patient is not familiar with Maxzide and this  may be a mistake on our part; however, he is definitely to stop taking  hydralazine.  He has a followup with Dr. Era Skeen office on Thursday,  September 27, 2008, at 10:30.  Blood work will be taken for protime and  also for basic metabolic panel.  He sees Dr. Madelon Lips in 1 week.  He will  call [phone] to make that appointment.  The patient is urged to weigh  himself daily and to especially pay attention to increasing his  furosemide doses if his weight increases.  Blood work as of the day of  discharge, hemoglobin 13.4, hematocrit 42.4, white cells 10.7, and  platelets of 242.  Serum electrolytes on the day of discharge:  Sodium  is 141, potassium 4.5, chloride 99, carbonate 32, BUN is 44, creatinine  1.86, glucose was 82.  A record of the Coumadin doses this admission  will be sent to Dr. Era Skeen office.  They show the doses of Coumadin  daily alongside the INRs.  Protime on the day of discharge is 24.5, INR  2.1, alkaline phosphatase this admission 79, SGOT 23,  SGPT is 31.  Troponin I studies of 0.02 then 0.01 then 0.01.  TSH this admission was  2.755.  HGB A1c is 7.1.  The BNP on admission was 387.  Once again,  synovial fluid culture which was obtained on September 22, 2008, shows no  crystals seen, many white blood cells.      Maple Mirza, [place]      Eddie Salvia, MD, [facility]  Electronically Signed    GM/MEDQ  D:  09/24/2008  T:  09/25/2008  Job:  [id number]   cc:   Lucy Antigua, MD

## 2011-01-13 NOTE — Cardiovascular Report (Signed)
NAME:  ADOLPH, CLUTTER NO.:  [id number]   MEDICAL RECORD NO.:  [id number]          PATIENT TYPE:  INP   LOCATION:  1824                         FACILITY:  [facility]   PHYSICIAN:  Duke Salvia, MD, FACCDATE OF BIRTH:  11/12/1955   DATE OF PROCEDURE:  09/19/2008  DATE OF DISCHARGE:                            CARDIAC CATHETERIZATION   Mr. Froelich was seen at the office today at the request of Arnette Felts, PA-  C.  He is a 55 year old, obese, Caucasian male who presents with massive  fluid overload and atrial fibrillation with a rapid ventricular  response.   He has a history of atypical chest pain for which he underwent  cathization in 2008 demonstrating nonobstructive disease.  He had normal  left ventricular function.  He was found to have atrial flutter in the  spring of 2009 with 2:1 conduction and subsequently underwent catheter  ablation for his atrial flutter which was successful.  He had, as part  of the evaluation, ultrasonography demonstrating modest depression of LV  function in the mid-40s.  It was hoped that it would improve following  ablation.  We do not have access to that data.   His co-morbid conditions include obstructive sleep apnea, hypertension -  particularly diastolic, and obesity as noted.   He has had 2 major back surgeries since his flutter ablation, most  recently in July.  The flutter was associated with poor healing,  apparently finally healing in the early fall.   Some time in the fall since he saw Arnette Felts 2-3 months ago, his  condition has deteriorated significantly and relatively rapidly.  He has  had problems with progressive shortness of breath, peripheral edema, and  his weights which were 310 pounds earlier in the middle of last summer,  were 380 pounds yesterday.  They were noted to be 420 pounds today.  He  describe his feet feeling like sausages.  His legs are progressively  swollen as is his abdomen.  He had modest but not  significant anorexia.   His thromboembolic risk factors are notable for hypertension, congestive  heart failure.   In the last week, however, he has had 2 spells where he has fallen to  the ground abruptly.  One was while cooking, the other was leaning on  the counter.  He described his left side going weak and collapsing to  the ground.  Notably, he was able to get right back up without any  untoward symptoms.   His past medical history in addition to the above is notable for:  1. Gastroesophageal reflux disease.  2. Sexual dysfunction.  3. Gout.   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   SOCIAL HISTORY:  He is divorced.  He has no children.  He does not use  cigarettes, alcohol, or recreational drugs.   His current medications include:  1. Bystolic 30.  2. Hydralazine 50 b.i.d.  3. Furosemide 20 t.i.d.  4. Maxzide 75/50.  5. Tussionex.  6. Simvastatin 80.  7.  Nexium 40.  8. Lasix.  9. Benicar 40.   He is ALLERGIC TO HYDROCHLOROTHIAZIDE although that causes gout,  although he has taken it apparently in his formulation with Maxzide  which we will discontinue today.  He is also ALLERGIC TO AZOR which I  think is a new ACE inhibitor.   On examination, his blood pressure was 132/72, his pulse was 72, his  weight was 427 pounds.  This is up nearly 100 pounds since he was seen  last year.  HEENT:  Exam demonstrated no evidence of xanthoma.  His neck was so full  I could not see his neck veins.  His carotids were brisk and full.  BACK:  Was without kyphosis or scoliosis.  There was a dimple-like site  of about 3 cm in diameter at the site of his back surgery.  There was no  CVA tenderness.  HEART:  Sounds were rapid and irregular.  Murmurs could not be  appreciated.  ABDOMEN:  Protuberant.  Liver was 2 cm below the costal margin.  No  midline pulsation could be felt.  Femoral pulses were not  palpable.  Distal pulses were not palpable.  His  legs were very tensely edematous.  His sausage toes were not examined.  NEUROLOGICAL:  Exam was grossly normal.  SKIN:  Notable for significant erythema in the lower extremities.   Electrocardiogram dated 09/18/2008 demonstrated atrial fibrillation with  a rapid ventricular response at a rate of about 120 beats per minute.   IMPRESSION:  1. Congestive heart failure - acute on chronic likely mixed      diastolic/systolic.  2. Atrial fibrillation with a rapid ventricular response contributing      to #1.  3. Cardiomyopathy previously with ejection fraction of 40% last year      and normal about 2 years ago, with no known coronary disease at      catheterization 2 years ago.  4. Massive fluid overload.  5. Recurrent falls/syncope in the last 2 weeks, abrupt onset and      offset question mechanism.  6. Hypertension.  7. Obesity.  8. Recent back surgery with poor healing.  9. Obstructive sleep apnea, not wearing a mask.   DISCUSSION:  Mr. Krasowski has significant and massive fluid overload  contributing to severe congestive heart failure manifested by class 4  shortness of breath symptoms, nocturnal dyspnea, and orthopnea.  His  rapid atrial fibrillation may well be contributing to this.  Symptom  relief is going to require both diuresis, I think, as well as slowing of  the ventricular rate which will be most easily accomplished by the  restoration of sinus rhythm if possible.  Given the recent falls, his  primary care Tynslee Bowlds felt that Coumadin was contraindicated.  I think  in the short term, however, we must consider anticoagulation with hopes  of restoring sinus rhythm.   Based on the above, we will therefore:  1. Admit.  2. Begin heparin.  3. Begin Coumadin.  4. Begin amiodarone intravenously for augmented rate control.  5. Intravenous diuresis.  6. Nocturnal CPAP.  7. Plan TEE-guided cardioversion on Friday.  8. We will also  check his thyroid, hemoglobin A1c.      Duke Salvia, MD, [facility]  Electronically Signed     SCK/MEDQ  D:  09/19/2008  T:  09/19/2008  Job:  [id number]   cc:   Roxanne Mins, PA-C

## 2011-01-13 NOTE — Assessment & Plan Note (Signed)
[facility]                         ELECTROPHYSIOLOGY OFFICE NOTE   NAME:Eddie Davis, Eddie Davis                       MRN:          [id number]  DATE:12/15/2007                            DOB:          1956/01/15    PRIMARY CARDIOLOGIST:  Dr. Chanda Busing.   PRIMARY CARE PHYSICIAN:  Dr. Shelva Majestic and Dr. Roxanne Mins, PA-C.   PRESENTING CIRCUMSTANCE:  Followup office visit for atrial flutter  ablation. This was a typical atrial flutter. Eddie Davis is a medical  patient of Dr. Harland Dingwall and Mr. Roxanne Mins who has a history of  diastolic congestive heart failure, hypertension, gout, gastroesophageal  reflux disease and obstructive sleep apnea. He has had a left heart  catheterization in April 2008 which showed scattered mild LAD disease,  dominant left circumflex without significant disease and a mild coronary  artery disease at 50%, normal left ventricular systolic function.   Earlier this year, Eddie Davis presented to the office of Dr. Shelva Majestic  complaining of a bloated feeling and significant weight gain. An  electrocardiogram was done which shows that the patient was atrial  flutter, typical pattern. The patient says that he does not feel  palpitations nor does he feel chest pressure. He is not particularly  short of breath either. He was referred to Dr. Sherryl Manges. He saw Dr.  Graciela Husbands in the office on November 17, 2007. Dr. Graciela Husbands recommended atrial  flutter ablation in the electrophysiology lab since his atrial flutter  rates were quite high and he had impending back surgery. The ablation  was done on March 27. He was discharged on Coumadin March 28. He has  been on Coumadin and has been therapeutic for about 2 weeks now and he  presents to the office today.   Eddie Davis says he is feeling fine. He has no restrictions to his  activity, no shortness of breath with exertion. He does not have that  bloated feeling which was consistent with considerable  volume overload.  He also mentions that about 4 years ago he had a prior experience of  feeling bloated and this was remedied by taking extra doses of diuretic.  Today he feels that perhaps he had an episode of this sort of  dysrhythmia that has now been ablated.   Electrocardiogram  today shows that the he is in bradycardia at a rate  of 53 beats per minute. The QRS is 110 msec.   Eddie Davis feels well after atrial flutter ablation. He is to continue on  Coumadin for 2 more weeks. We will in a setting of sinus bradycardia  decrease his Cardizem from 240 mg daily to 120 mg daily for another 2  week period and it is thought that he can go off his Cardizem at the  same time he goes off the Coumadin. We will leave this up to Dr. Shelva Majestic  since the patient also has hypertension and his Cardizem might act as an  extra antihypertensive agent. The patient's medication today on  presentation:   CURRENT MEDICATIONS:  1. Bystolic 10 mg daily.  2. Hydralazine 25 mg 4  times daily.  3. Cartia 240 mg daily.  4. Benicar 40 mg daily.  5. Lasix 20 mg 2 tabs daily.  6. Nexium 1 tab daily.  7. Coumadin. Now he is taking about 12 mg daily.  8. Simvastatin 40 mg at bedtime.   At this time, he does not need any further followup with Dr. Graciela Husbands. Once  again, we have cut his Cartia dose from 240 to 120 and perhaps this will  help increase the patient's intrinsic rhythm.      Maple Mirza, PA  Electronically Signed      Duke Salvia, MD, [facility]  Electronically Signed   GM/MedQ  DD: 12/15/2007  DT: 12/15/2007  Job #: [id number]   cc:   Macarthur Critchley. Shelva Majestic, M.D.  Roxanne Mins, PA-C

## 2011-01-13 NOTE — Letter (Signed)
November 17, 2007    Roxanne Mins, PA-C  [address]  [place], [place] [address]   RE:  Eddie, Davis  MRN:  [id number]  /  DOB:  1956-07-29   Dear Kathlene November:   It was a pleasure to see Eddie Davis at your request today because of  his atrial flutter.   As you know, he is a morbidly obese Caucasian gentleman with a history  of poorly controlled hypertension, diastolic heart failure, some  atypical chest pain for which he underwent catheterization.  In April  2008 he demonstrated nonobstructive coronary disease, normal left  ventricular systolic function, who came to the office on Monday feeling  a little bit weak, puffy and short of breath.  You noted that he was in  atrial flutter at 2:1.  You worked on rate controlling and diuresing  him.  He saw you yesterday and was marked improved, but still quite  symptomatic and still very tachycardic.  He was unaware of palpitations.  He was some aware of some impaired exercise tolerance, which has  improved also since Monday under your medicinal therapy.   He has lost about 60 pounds over the last couple of months and has been  feeling better.   He has obstructive sleep apnea for which a mask was being worn for some  period of time.  He has not been wearing it recently and he is not  feeling significant daytime fatigue.   His thromboembolic risk factors are notable for hypertension and  congestive heart failure, albeit diastolic.  His cardiac review of  systems is notable for peripheral edema which has been worse over the  last week or two, occasional tachy palpitations, some lightheadedness,  but no pain.   His past medical history in addition is notable for:  1.  GU reflux  disease.  2.  Sexual dysfunction.  3.  Gout.  4.  Obstructive sleep  apnea as noted previously.   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   Social history, he is divorced,  he has no children.  He does not use  cigarettes, alcohol or recreational drugs.  He does not exercise very  much, but has been recently.   His current medications include Benicar 40,  Cardia 240, Hydralazine 25  q.i.d., Bystolic 10, Nexium, Coumadin started yesterday and Simvastatin.  He is allergic to hydrochlorothiazide causing gout, and Azur causes him  to cough.   On examination he is a well-developed, well-nourished, Caucasian male  appearing his stated age of 63.  His blood pressure was 130/80 and pulse  rate was __________.  His HEENT demonstrates no acute problems.  His  neck veins were flat.  His carotids were brisk and full bilaterally  without bruits.  His back was without kyphosis or scoliosis.  His lungs  were clear.  Heart sounds were irregular, rapid, with an early systolic  murmur.  The abdomen was protuberant, was soft.  Femorals were present.  Distal pulses were intact.  There was no clubbing or cyanosis.  There  was 1+ peripheral edema bilaterally.  The skin was warm and dry.  Neurological exam was grossly normal.   Electrocardiogram dated today, which we repeated because of the  irregularity, also demonstrated as had yours, atrial flutter.  There was  some block.  The atrial cycle length was approximately 240 milliseconds.  The ventricular response was mostly  2:1.  The intervals were  hyperreflexes 0.11/0.65 __________ .   IMPRESSION:  1. Atrial flutter-typical with 2:1 ventricular conduction.  2. Chronic diastolic heart failure.  3. Hypertension-significant.  4. Obstructive sleep apnea, question contributing #1 and #3.  5. Morbid obesity, question contributing to the above.  6. Nonobstructive coronary disease with previously normal left      ventricular function.   DISCUSSION:  Kathlene November, Eddie Davis has atrial flutter in the context of a [place]  score of 2.  Given that he would be a candidate for long term Coumadin.  It is likely that his flutter is amenable to  catheter ablation with the  risk of about 1 in 1000 which compares to a 15 to 20 per 1000 annualized  risk of thromboembolic hemorrhagic disease and with [place] score of 2 on  Coumadin therapy.   We have discussed the potential benefits of catheter ablation, as well  as the potential risks to include, but not limited to death,  perforation, heart block, as well as hemorrhage to his groin.  He  understands these risks and would like to proceed.   You began him on Coumadin yesterday.  As we discussed, he will come by  on Monday to get his INR checked and we anticipate that his INR will be  therapeutic on Friday and would then outpatient to proceed with catheter  ablation.  Given the short period of time, however, prior to therapeutic  INR, he would need a transesophageal echocardiogram to help reach that  goal.   Physical examination __________ impending back surgery, we will go back  to the way we were.  As it relates to his impending back surgery, it  should be delayed because his atrial flutter is quite rapid, so I think  his cardiovascular risks are unacceptably high, as this would be an  elective procedure.  But we need act as judiciously as possible for  relief of his back symptoms.  What I would suggest we do is pursue  catheter ablation I have tentatively scheduled for next Friday, November 25, 2007, and then anticipate that about 3 weeks after that his Coumadin  could be discontinued and his surgery could be done __________   Kathlene November, thank you very much for this consultation.  I will keep you  abreast of the situation.    Sincerely,      Duke Salvia, MD, [facility]  Electronically Signed    SCK/MedQ  DD: 11/17/2007  DT: 11/17/2007  Job #: [id number]   CC:    Macarthur Critchley. Shelva Majestic, M.D.

## 2011-01-13 NOTE — Op Note (Signed)
NAMESADAO, WEYER NO.:  [id number]   MEDICAL RECORD NO.:  [id number]          PATIENT TYPE:  INP   LOCATION:  2008                         FACILITY:  [facility]   PHYSICIAN:  Duke Salvia, MD, FACCDATE OF BIRTH:  May 14, 1956   DATE OF PROCEDURE:  11/25/2007  DATE OF DISCHARGE:  11/26/2007                               OPERATIVE REPORT   PREOPERATIVE DIAGNOSIS:  Atrial flutter.   POSTOPERATIVE DIAGNOSIS:  Atrial flutter.   PROCEDURE:  Invasive electrophysiological study, arrhythmia mapping and  radiofrequency catheter ablation.   PROCEDURE IN DETAIL:  Following obtaining informed consent the patient  was brought to the electrophysiology laboratory and placed on the  fluoroscopic table in supine position.  After routine prep and drape  cardiac catheterization was performed with local anesthesia and  conscious sedation.  Noninvasive blood pressure monitoring,  transcutaneous oxygen saturation monitoring and end-tidal CO2 monitoring  were performed in conjunction with CPAP throughout the procedure.  Following the procedure the catheters were removed and the sheaths were  left in place.  The patient was transferred to the holding area in  stable condition.   CATHETERS:  A 5 French quadripolar catheter was inserted via left  femoral vein at AV junction.  A 6 French octapolar catheter was inserted via the right femoral vein to  the coronary sinus.  A 7 [place] duo decapolar catheter was inserted via the left femoral vein  to the tricuspid annulus.  An 8 French 8 mm deflectable tip ablation catheter was inserted via SAFL  sheath from the right femoral vein to mapping sites in the posterior  septal space.   Surface leads 1, aVF and V1 were monitored continuously throughout the  procedure.  Following insertion of the catheters stimulation protocol  included incremental atrial pacing, incremental ventricular pacing,  single atrial extrastimuli at paced cycle length  of 600 milliseconds,  entrainment mapping from the coronary sinus in the cavotricuspid  isthmus.   RESULTS:  Surface electrocardiogram.  Initial:  Rhythm is atrial flutter; RR interval 463 milliseconds; AA interval 233  milliseconds; PR interval:  N/A; QRS duration 92 milliseconds; QT  interval 325 milliseconds; P-wave duration N/A; AH interval:  N/A; AH  interval 45 milliseconds.  Final:  Rhythm:  Sinus; RR interval 946 milliseconds; PR interval 180  milliseconds; QRS duration 110 milliseconds; QT interval 436  milliseconds; P-wave duration 143 milliseconds; bundle branch block:  Absent; AH interval:  90 milliseconds; HV interval:  40 milliseconds.  AV nodal function.  AV Wenckebach was 400 milliseconds.  Retrograde Wenckebach was 450 milliseconds.  AV nodal effective refractory period at a paced cycle length of 600  milliseconds was 280 milliseconds.  AV nodal conduction was continuous  until just prior to AV nodal ERP where the AH interval stretched out to  300 milliseconds or so.   Accessory pathway function:  No evidence of an accessory pathway was  identified.   Arrhythmias induced.  The patient presented to the lab in atrial  flutter.  Electrogram mapping demonstrated counterclockwise rotation.  Entrainment mapping from the distal coronary sinus and the cavotricuspid  isthmus confirmed cavotricuspid isthmus involvement in the reentrant  circuit.   Radiofrequency energy.  Radiofrequency energy was then applied across  the cavotricuspid isthmus.  Obliteration of most electrograms was  accomplished after a total of 21 minutes and 50 seconds of RF.  The  patient had a prominent ridge which was ultimately ablated with  retroflexion of the catheter.  This was associated with cavotricuspid  isthmus conduction block and an A1 and A2 interval of 440 milliseconds.  Bidirectional block was demonstrated.   Fluoroscopy time in total was 14 minutes and 56 seconds of fluoroscopy  was  utilized at 7-1/2 frames per second.   IMPRESSION:  1. Normal sinus function.  2. Abnormal atrial function manifested by sustained atrial flutter.      This was successfully ablated with cavotricuspid isthmus conduction      block demonstrated.  3. Normal AV nodal function.  4. Normal His-Purkinje system function.  5. No accessory pathway.  6. Normal ventricular spots to programmed stimulation.   SUMMARY:  In conclusion, the results of electrophysiological testing  confirmed cavotricuspid isthmus dependent atrial flutter.  RF energy  successfully terminated the flutter and interrupted the substrate.  The  patient will be observed overnight.  The patient had undergone  preprocedural transesophageal echo and he will need to be managed with  anticoagulation with heparin until his INR is therapeutic.      Duke Salvia, MD, [facility]  Electronically Signed     SCK/MEDQ  D:  11/25/2007  T:  11/26/2007  Job:  [id number]

## 2011-01-13 NOTE — Op Note (Signed)
NAME:  Eddie Davis, Eddie Davis                ACCOUNT NO.:  [id number]   MEDICAL RECORD NO.:  [id number]          PATIENT TYPE:  AMB   LOCATION:  DAY                          [facility]ACILITY[facility]  [facility]   PHYSICIAN:  Marlowe Kays, M.D.  DATE OF BIRTH:  March 14, 1956   DATE OF PROCEDURE:  03/21/2008  DATE OF DISCHARGE:                               OPERATIVE REPORT   PREOPERATIVE DIAGNOSIS:  Post decompression L3-4, L4-5, L5-S1 with  seroma formation with secondary recurrent spinal stenosis.   POSTOPERATIVE DIAGNOSIS:  Post decompression L3-4, L4-5, L5-S1 with  seroma formation with secondary recurrent spinal stenosis.   OPERATION:  Exploration prior lumbar incision with excision of  significant postoperative scar down to the epidural area and release of  seroma in the right paralumbar area.   SURGEON:  Marlowe Kays, M.D.   ASSISTANTDruscilla Brownie. Idolina Primer, P.A.-C.   ANESTHESIA:  General anesthesia.   JUSTIFICATION FOR PROCEDURE:  He had the 3-level decompression a little  over 2 months ago and states that for the first 2 weeks post surgery, a  complete relief of his buttock and leg pain, which subsequently  returned.  Along with this, he had a reaction to the skin staples and  this had some persistent drainage from the lower portion of the surgical  incision.  Cultures essentially have been negative but he was maintained  on antibiotics.  He weighs 365 pounds, which may be a factor in  symptomatology but he has had progressive disability with low back,  buttock and leg pain limiting his ability to walk with a spinal stenosis-  type picture.  He had a lumbar MRI performed on March 08, 2008, which  measured a 4 x 2.5 x 1.5 cm fluid collection in the posterior epidural  space.  It was also felt that he had some persistent foraminal stenosis  at L4-5 even though we had thoroughly decompressed him at the initial  surgical procedure and also postoperatively he had no symptoms.  Because  of this clinical  picture, he is here today for excision of the sinus  tract and exploration of the wound with drainage of the seroma.   PROCEDURE:  No prophylactic antibiotics, satisfactory general  anesthesia, prone position on rolls.  Back was prepped with DuraPrep,  draped in sterile field.  Ioban employed.  Time-out performed.  I first  made a wide excision of the sinus tract area extending incision  proximally and distally.  Dense scar tissue was immediately encountered  using mainly a cutting cautery.  I worked my way down deep into the  paralumbar muscle level.  He is a large man and he had significant scar  particularly where the sinus tract was which appeared to be a  compressive type of pathology.  The seroma was not immediately apparent.  In order to gauge the depth of the dissection and not enter into the  dura, I was able to palpate the sacrum, residual bone cephalad and the  lateral bone as well by finger palpation and went down to this depth.  All fixed fibrous tissue was removed from the  epidural area.  I then did  find a seroma in the right paralumbar area around the L5 area and this  was drained digitally, it was clear fluid.  Culture and Gram stain were  taken.  Gram stain subsequently came back no organisms noted.  There was  not enough of the cavity to warrant a drain.  After irrigating the wound  well with sterile saline, I closed the wound snugly with interrupted #1  Vicryl in multiple layers, 2-0 Vicryl subcutaneous tissue and staples  and most of the skin incision of  the central portion where we excised the sinus tract I closed with 2-0  nylon.  Betadine, Adaptic and dry sterile dressing were applied.  He  tolerated the procedure well and was taken to the recovery room in  satisfactory condition with no known complications.  Estimated blood  loss was perhaps 250 mL.  No blood replacement.           ______________________________  Marlowe Kays, M.D.     JA/MEDQ  D:   03/21/2008  T:  03/21/2008  Job:  [id number]

## 2011-01-13 NOTE — Op Note (Signed)
NAME:  IZEN, PETZ                ACCOUNT NO.:  [id number]   MEDICAL RECORD NO.:  [id number]          PATIENT TYPE:  AMB   LOCATION:  DAY                          [facility]AC[facility]   PHYSICIAN:  Marlowe Kays, M.D.  DATE OF BIRTH:  09-12-1955   DATE OF PROCEDURE:  01/12/2008  DATE OF DISCHARGE:                               OPERATIVE REPORT   PREOPERATIVE DIAGNOSES:  1. Pars defect type spondylolisthesis L4, with spondylolisthesis L4-5,      and secondary spinal stenosis.  2. Central stenosis L3-4, and foraminal stenosis L5-S1.   POSTOPERATIVE DIAGNOSES:  1. Pars defect type spondylolisthesis L4, with spondylolisthesis L4-5,      and secondary spinal stenosis.  2. Central stenosis L3-4, and foraminal stenosis L5-S1.   OPERATIONS:  1. Gill procedure L4.  2. Central and foraminal decompression L3-4 and L5-S1.   SURGEON:  Marlowe Kays, M.D.   ASSISTANT:  Georges Lynch. Darrelyn Hillock, M.D.   ANESTHESIA:  General.   PATHOLOGY AND JUSTIFICATION FOR PROCEDURE:  He sustained an injury to  his back on the job on September 07, 2007.  He has had back and bilateral  leg pain, with an MRI demonstrating the above diagnoses.  Because of  continued back and bilateral leg pain, we have turned to surgical  correction of the problem.  He has had cardiology clearance prior to  surgery.   PROCEDURE:  Prophylactic antibiotic, satisfied general anesthesia, prone  position on rolls.  We prepped his back with DuraPrep, draped in a  sterile field.  Time-out performed.  We made a midline incision centered  about the appropriate levels and made my initial x-ray after exposing  the two spinous processes at this level and tagging them with Kocher  clamps.  An x-ray demonstrated that we were on L3 and L4.  Consequently  I extended the incision distalward, and we dissected soft tissue off the  neural arches of L3, L4, and L5.  I then took a second confirmatory x-  ray after removing a portion of the  neural arch  of L4 and placing a  hockey stick beneath the remaining neural arch, as well as tagging with  Kocher clamps what I felt was the inferior portion of L3 and L5.  After  confirming once again that these were the anatomic locations, I  continued removal with double-action rongeurs the spinous process of  neural arch of L5 and a portion of the spinous process and neural arch  of L3.  I began central and lateral decompression at L4.  The loose pars  portions of bone required a little bit of extra care in dissection, but  we were able to remove them bilaterally with a combination of Kerrison  rongeurs and double-action rongeur.  I then completed the decompression,  working proximally and distalward with the 2, 3, and 4 mm Kerrison  rongeurs.  Approximately at L3, we kept working cephalad until the  spinal canal appeared to be thoroughly decompressed, and distally until  the canal was not only decompressed but the foramina of the S1 nerve  roots were also patent  to hockey-stick.  I also checked, and the  foramina of L5 and L4 nerve roots were also found to be well  decompressed.  The final bit of decompression was performed with via the  microscope.  We had a continuous ooze throughout the case, no one  specific area of bleeding, and consequently I used a 1/4-inch Penrose  drain through the right posterior buttock area.  Gelfoam was placed over  the dura, self-retaining retractors were carefully removed, and I then  close the wound under direct visualization to avoid piercing the drain,  with interrupted #1 Vicryl in the fascia and deep subcutaneous tissue, 2-  0 Vicryl in the superficial subcutaneous tissue, and staples in the  skin.  Betadine and Adaptic dry sterile dressing were applied.  He was  gently rolled onto the PACU bed and taken there in satisfactory  condition, with no known complications.   ESTIMATED BLOOD LOSS:  500 mL.  No blood replacement.            ______________________________  Marlowe Kays, M.D.     JA/MEDQ  D:  01/12/2008  T:  01/12/2008  Job:  [id number]

## 2011-01-16 NOTE — Cardiovascular Report (Signed)
Eddie Davis, Eddie Davis NO.:  [id number]   MEDICAL RECORD NO.:  [id number]          PATIENT TYPE:  OIB   LOCATION:  2899                         FACILITY:  [facility]   PHYSICIAN:  Madaline Savage, M.D.DATE OF BIRTH:  April 09, 1956   DATE OF PROCEDURE:  12/28/2006  DATE OF DISCHARGE:                            CARDIAC CATHETERIZATION   PROCEDURES PERFORMED:  1. Selective coronary angiography by Judkins technique.  2. Retrograde left heart catheterization.  3. Left ventricular angiography.  4. AngioSeal closure of the right femoral artery.   CHIEF COMPLAINT:  Fullness in chest, lightheadedness, nausea, low energy  and eructation.   HISTORY OF PRESENT ILLNESS:  The patient is a 55 year old gentleman who  is a medical patient of Dr. Harland Dingwall who is morbidly obese (weight  is 375 pounds), has had hypertension for 5 years, has obstructive sleep  apnea on CPAP, has lumbar disk disease, hyperlipidemia and gout.  He has  Norvasc intolerance which causes cough.  His current medications include  Benicar, Lasix, hydralazine and allopurinol.  He has gout.  EKG in the  emergency room showed normal sinus rhythm and was basically  unremarkable.  The patient enters the cath lab today for elective  cardiac catheterization given his current symptoms and his risk factors.   RESULTS:  Pressures:  Left ventricular pressure was 180/1, end-diastolic  pressure 24, central aortic pressure 180/100, mean of 135.  No aortic  valve gradient by pullback technique.   ANGIOGRAPHIC RESULTS:  The patient has scattered calcifications at the  left main coronary artery near the bifurcation of the dominant left  circumflex coronary artery and in the mid-right coronary artery.  The  left main is hard to visualize in multiple views and appears to be  normal, however; although, it is poorly seen.  The ostial LAD is mildly  calcified and contains an eccentric narrowing of 50%.  There was a  second  stenosis surrounding the LAD near the septal perforator branch,  both proximal and distal to the first septal perforator that is 50%.  The distal vessels are small.   Left circumflex coronary artery is the dominant vessel with circulation.  It gives rise to a single obtuse marginal branch which the largest blood  vessel in the circulation, an ongoing circumflex that courses to the  lower anterolateral wall and supplies both the posterior descending and  posterolateral branch.  No significant lesions were seen.   Right coronary artery is nondominant containing several areas of 40-50%  area of narrowing, specifically, mid RA mid RCA and a pulmonary conus  branch.   Left ventricular angiogram shows LVEF to be approximately 50-60% with no  significant wall motion abnormalities.  It is a suboptimal study  angiogram, however.  Right femoral artery AngioSeal was accomplished  without any problems.   FINAL IMPRESSION:  1. Scattered mild LAD disease, three locations, see report.  2. Dominant circumflex without significant disease.  3. Mild RCA disease of about 50%.  4. Normal LV systolic function.   PLAN:  I would recommend that this patient continue cardiac risk  reduction as  he has been receiving from Dr. Shelva Majestic.  He is currently on  Benicar, Lasix, potassium, hydralazine and allopurinol.  We would  encourage him to follow-up closely with Dr. Shelva Majestic as his 50% lesions  are likely to get worse if the patient fails to comply with his  medications or if his blood pressure remains elevated. Plan to follow up  is with Dr. Harland Dingwall.           ______________________________  Madaline Savage, M.D.     WHG/MEDQ  D:  12/28/2006  T:  12/28/2006  Job:  [id number]   cc:   [facility]  [facility] Medical Records  Macarthur Critchley. Shelva Majestic, M.D.

## 2011-01-16 NOTE — Procedures (Signed)
NAME:  Eddie Davis, Eddie Davis                ACCOUNT NO.:  [id number]   MEDICAL RECORD NO.:  [id number]          PATIENT TYPE:  OUT   LOCATION:  SLEEP CENTER                 FACILITY:  [facility]   PHYSICIAN:  Clinton D. Maple Hudson, M.D. DATE OF BIRTH:  03/07/1956   DATE OF STUDY:  06/12/2004                              NOCTURNAL POLYSOMNOGRAM   STUDY DATE:  June 12, 2004   REFERRING PHYSICIAN:  Macarthur Critchley. Shelva Majestic, M.D.   INDICATION FOR STUDY:  Hypersomnia with obstructive sleep apnea.  Diagnostic  NPSG done June 01, 2004 demonstrated severe obstructive apnea with an RDI  of 99/hr and oxygen desaturation to 50%.  He returns now for CPAP titration.   EPWORTH SLEEPINESS SCORE:  9/24   NECK SIZE:  22-1/2 inches   BODY MASS INDEX:  42   WEIGHT:  350 pounds   SLEEP ARCHITECTURE:  Total sleep time 355 minutes with sleep efficiency 97%,  stage I was 2%, stage II was 52%, stages III and IV were absent, REM was 47%  of total sleep time, sleep latency 5.5 minutes, REM latency 68 minutes,  awake after sleep onset 8 minutes, arousal index 11.   RESPIRATORY DATA:  CPAP titration protocol.  CPAP was titrated to 22 CWP.  Adequate control appears to have been obtained at 20 CWP, RDI 0/hr.  A large  Ultra Mirage Full Face Mask was used with a heated humidifier.   OXYGEN DATA:  Loud snoring with severe oxygen desaturation to 50% was  demonstrated before CPAP.  In addition to CPAP, the technician added 4 L of  nasal oxygen because of continued desaturations.  It is not entirely clear  from the overlapping recordings that supplemental oxygen will be required on  therapeutic CPAP.   CARDIAC DATA:  Normal sinus rhythm with occasional PAC.   MOVEMENT/PARASOMNIA:  Occasional leg jerk with insignificant effect on  sleep.   IMPRESSION/RECOMMENDATION:  Successful continuous positive airway pressure  titration taken to 22 CWP but with adequate control on 20 CWP, RDI 1/hr  using a large Ultra Mirage Full Face  Mask with heated humidifier.  Severe  oxygen desaturation was again noted before continuous positive airway  pressure control.  Consider  establishing the patient on continuous positive airway pressure and then  checking an overnight oximetry while on continuous positive airway pressure  to determine whether he needs supplemental oxygen in addition.      CDY/MEDQ  D:  06/15/2004 13:22:48  T:  06/16/2004 08:30:15  Job:  [id number]

## 2011-01-16 NOTE — Procedures (Signed)
NAME:  Eddie Davis, Eddie Davis                ACCOUNT NO.:  [id number]   MEDICAL RECORD NO.:  [id number]          PATIENT TYPE:  OUT   LOCATION:  SLEEP CENTER                 [facility]ACILITY[facility]   PHYSICIAN:  Clinton D. Maple Hudson, M.D. DATE OF BIRTH:  [birth year]   DATE OF STUDY:  06/01/2004  DATE OF DISCHARGE:  06/01/2004                              NOCTURNAL POLYSOMNOGRAM   REFERRING PHYSICIAN:  Harland Dingwall, M.D.   INDICATIONS FOR STUDY:  Hypersomnia with sleep apnea; therefore, sleepiness  score of 9/24. Neck size 22.5 inches, BMI 42, weight 350 pounds.   SLEEP ARCHITECTURE:  No sleep medication taken.  Total sleep time 291  minutes with sleep efficiency of 79%.  Stage I was 19%, stage II 17%, stages  III and stage IV were absent, and REM was 9% of total sleep time. Sleep  latency was 10 minutes. REM latency 164 minutes. Awake during sleep 67  minutes. Arousal index 90 which is markedly increased and primarily reflects  respiratory events.   RESPIRATORY DATA:  NPSG protocol. Very severe obstructive apnea/hypopnea  syndrome, RDI 99 per hour reflecting 378 obstructive apneas, 102 hypopneas.  Events were not positional. REM RDI was 74.   OXYGEN DATA:  Technician had difficulty with oximeter describing patient's  hands as cold as ice. Oximeter was placed on the ear lobe for reading.  There was severe and persistent desaturation as low as 50% on room air. The  patient was placed on two liters of oxygen providing only modest  improvement. Mean oxygen saturation through the study was 83% to 85%.  Snoring was very loud.   CARDIAC DATA:  Sinus rhythm was sinus bradycardia especially during apneas  to 42 per minute, occasional PAC and PVC.   MOVEMENT/PARASOMNIA:  One restroom trip. No abnormal leg jerks.   IMPRESSION/RECOMMENDATIONS:  Severe obstructive sleep apnea/hypopnea  syndrome, RDI 99 per hour with severe oxygen desaturation to 50%, only  modestly improved by supplemental oxygen. Consider  early return for CPAP  titration or evaluation for alternative therapies. Is there a baseline  cardiopulmonary disease?      CDY/MEDQ  D:  06/08/2004 09:20:17  T:  06/08/2004 78:46:96  Job:  [id number]

## 2011-02-05 ENCOUNTER — Other Ambulatory Visit: Payer: Self-pay | Admitting: Adult Health

## 2011-02-09 NOTE — Telephone Encounter (Signed)
[place] pt. 

## 2011-02-11 NOTE — Telephone Encounter (Signed)
[place] pt. 

## 2011-02-12 ENCOUNTER — Other Ambulatory Visit: Payer: Self-pay | Admitting: *Deleted

## 2011-02-12 ENCOUNTER — Other Ambulatory Visit: Payer: Self-pay | Admitting: Internal Medicine

## 2011-02-12 MED ORDER — TORSEMIDE 20 MG PO TABS: 20.0000 mg | ORAL_TABLET | Freq: Every day | ORAL | Status: DC

## 2011-04-09 ENCOUNTER — Other Ambulatory Visit: Payer: Self-pay | Admitting: Internal Medicine

## 2011-05-08 ENCOUNTER — Other Ambulatory Visit: Payer: Self-pay | Admitting: Internal Medicine

## 2011-05-25 LAB — BASIC METABOLIC PANEL
Chloride: 105
GFR calc Af Amer: >60
Potassium: 3.6
Sodium: 139

## 2011-05-25 LAB — APTT: aPTT: 26

## 2011-05-25 LAB — CBC
HCT: 42.9
Hemoglobin: 14.3
RBC: 4.86
RDW: 15.6 — ABNORMAL HIGH
WBC: 10.2

## 2011-05-27 LAB — CBC
HCT: 33.2 — ABNORMAL LOW
Hemoglobin: 11.2 — ABNORMAL LOW
MCHC: 33.8
MCV: 86.6
RBC: 3.83 — ABNORMAL LOW
WBC: 8.8

## 2011-05-27 LAB — COMPREHENSIVE METABOLIC PANEL
AST: 24
BUN: 13
CO2: 29
Calcium: 8.7
Chloride: 105
Creatinine, Ser: 1.14
GFR calc Af Amer: >60
GFR calc non Af Amer: >60
Glucose, Bld: 115 — ABNORMAL HIGH
Total Bilirubin: 0.8

## 2011-05-27 LAB — DIFFERENTIAL
Basophils Absolute: 0
Eosinophils Relative: 1
Lymphocytes Relative: 22
Neutrophils Relative %: 69

## 2011-05-27 LAB — PROTIME-INR
INR: 1
Prothrombin Time: 13.2

## 2011-05-28 LAB — BASIC METABOLIC PANEL
CO2: 27
Calcium: 8.2 — ABNORMAL LOW
Creatinine, Ser: 0.92
GFR calc Af Amer: >60
GFR calc non Af Amer: >60
Glucose, Bld: 107 — ABNORMAL HIGH

## 2011-05-28 LAB — CBC
MCHC: 33.3
RDW: 16.2 — ABNORMAL HIGH

## 2011-05-28 LAB — DIFFERENTIAL
Basophils Absolute: 0
Basophils Relative: 0
Neutro Abs: 4.6
Neutrophils Relative %: 64

## 2011-05-29 LAB — TISSUE CULTURE
Culture: NO GROWTH
Culture: NO GROWTH

## 2011-05-29 LAB — CBC
HCT: 39.5
Hemoglobin: 13.3
Platelets: 184
RBC: 4.69
WBC: 8.6

## 2011-05-29 LAB — WOUND CULTURE
Culture: NO GROWTH
Gram Stain: NONE SEEN

## 2011-05-29 LAB — GRAM STAIN: Gram Stain: NONE SEEN

## 2011-05-29 LAB — BASIC METABOLIC PANEL
GFR calc Af Amer: >60
GFR calc non Af Amer: >60
Potassium: 4.1
Sodium: 139

## 2011-05-29 LAB — ANAEROBIC CULTURE: Gram Stain: NONE SEEN

## 2011-08-03 ENCOUNTER — Other Ambulatory Visit: Payer: Self-pay | Admitting: Internal Medicine

## 2011-11-05 ENCOUNTER — Other Ambulatory Visit: Payer: Self-pay | Admitting: Internal Medicine

## 2011-11-05 ENCOUNTER — Other Ambulatory Visit: Payer: Self-pay | Admitting: Adult Health

## 2011-11-05 NOTE — Telephone Encounter (Signed)
Patient needs appt

## 2013-07-11 ENCOUNTER — Encounter (HOSPITAL_COMMUNITY): Payer: Self-pay | Admitting: Emergency Medicine

## 2013-07-11 ENCOUNTER — Emergency Department (HOSPITAL_COMMUNITY): Payer: Medicare HMO

## 2013-07-11 ENCOUNTER — Emergency Department (HOSPITAL_COMMUNITY)
Admission: EM | Admit: 2013-07-11 | Discharge: 2013-07-11 | Disposition: A | Discharge status: Home / Self Care | Payer: Medicare HMO | Attending: Emergency Medicine | Admitting: Emergency Medicine

## 2013-07-11 DIAGNOSIS — Z79899 Other long term (current) drug therapy: Secondary | ICD-10-CM | POA: Insufficient documentation

## 2013-07-11 DIAGNOSIS — I498 Other specified cardiac arrhythmias: Secondary | ICD-10-CM | POA: Insufficient documentation

## 2013-07-11 DIAGNOSIS — R0609 Other forms of dyspnea: Secondary | ICD-10-CM | POA: Insufficient documentation

## 2013-07-11 DIAGNOSIS — R0600 Dyspnea, unspecified: Secondary | ICD-10-CM

## 2013-07-11 DIAGNOSIS — Z8719 Personal history of other diseases of the digestive system: Secondary | ICD-10-CM | POA: Insufficient documentation

## 2013-07-11 DIAGNOSIS — I4891 Unspecified atrial fibrillation: Secondary | ICD-10-CM | POA: Insufficient documentation

## 2013-07-11 DIAGNOSIS — R0989 Other specified symptoms and signs involving the circulatory and respiratory systems: Secondary | ICD-10-CM | POA: Insufficient documentation

## 2013-07-11 DIAGNOSIS — G4730 Sleep apnea, unspecified: Secondary | ICD-10-CM | POA: Insufficient documentation

## 2013-07-11 DIAGNOSIS — I509 Heart failure, unspecified: Secondary | ICD-10-CM | POA: Insufficient documentation

## 2013-07-11 DIAGNOSIS — R609 Edema, unspecified: Secondary | ICD-10-CM | POA: Insufficient documentation

## 2013-07-11 DIAGNOSIS — Z7901 Long term (current) use of anticoagulants: Secondary | ICD-10-CM | POA: Insufficient documentation

## 2013-07-11 DIAGNOSIS — I10 Essential (primary) hypertension: Secondary | ICD-10-CM | POA: Insufficient documentation

## 2013-07-11 HISTORY — DX: Barrett's esophagus without dysplasia: K22.70

## 2013-07-11 HISTORY — DX: Heart failure, unspecified: I50.9

## 2013-07-11 HISTORY — DX: Essential (primary) hypertension: I10

## 2013-07-11 HISTORY — DX: Unspecified atrial fibrillation: I48.91

## 2013-07-11 HISTORY — DX: Sleep apnea, unspecified: G47.30

## 2013-07-11 LAB — CBC WITH DIFFERENTIAL/PLATELET
Basophils Absolute: 0 10*3/uL (ref 0.0–0.1)
Basophils Relative: 0 % (ref 0–1)
Eosinophils Absolute: 0.1 10*3/uL (ref 0.0–0.7)
Eosinophils Relative: 1 % (ref 0–5)
Hemoglobin: 12.5 g/dL — ABNORMAL LOW (ref 13.0–17.0)
Lymphocytes Relative: 23 % (ref 12–46)
Lymphs Abs: 2.4 10*3/uL (ref 0.7–4.0)
MCH: 29.1 pg (ref 26.0–34.0)
MCHC: 33.5 g/dL (ref 30.0–36.0)
MCV: 86.7 fL (ref 78.0–100.0)
Monocytes Relative: 5 % (ref 3–12)
Neutrophils Relative %: 71 % (ref 43–77)
Platelets: 177 10*3/uL (ref 150–400)
RBC: 4.3 MIL/uL (ref 4.22–5.81)
RDW: 14.9 % (ref 11.5–15.5)

## 2013-07-11 LAB — BASIC METABOLIC PANEL
BUN: 16 mg/dL (ref 6–23)
Calcium: 9 mg/dL (ref 8.4–10.5)
Chloride: 103 mEq/L (ref 96–112)
GFR calc Af Amer: 80 mL/min — ABNORMAL LOW (ref >90)
GFR calc non Af Amer: 69 mL/min — ABNORMAL LOW (ref >90)
Glucose, Bld: 158 mg/dL — ABNORMAL HIGH (ref 70–99)
Potassium: 4.2 mEq/L (ref 3.5–5.1)

## 2013-07-11 NOTE — ED Provider Notes (Signed)
CSN: [id number]     Arrival date & time 07/11/13  1630 History   First MD Initiated Contact with Patient 07/11/13 1639     Chief Complaint  Patient presents with  . Shortness of Breath   (Consider location/radiation/quality/duration/timing/severity/associated sxs/prior Treatment) Patient is a 57 y.o. male presenting with shortness of breath.  Shortness of Breath  Pt with history of afib, CHF but no known CAD was in his normal state of health this morning. Just prior to arrival he had an episode of moderate SOB after walking up and down some stairs at his house. He did not have any chest pain, palpitations, racing heart. He is relatively deconditioned due to inability to exercises stemming from his chronic back pain. Denies any recent orthopnea or increase in baseline LE edema. He denies any cough or fever. He has been taking his medications as directed including digoxin and Xarelto among others. He states recently had ?increase in Bystolic.   No past medical history on file. No past surgical history on file. No family history on file. History  Substance Use Topics  . Smoking status: Not on file  . Smokeless tobacco: Not on file  . Alcohol Use: Not on file    Review of Systems  Respiratory: Positive for shortness of breath.    All other systems reviewed and are negative except as noted in HPI.   Allergies  Review of patient's allergies indicates not on file.  Home Medications   Current Outpatient Rx  Name  Route  Sig  Dispense  Refill  . carvedilol (COREG) 12.5 MG tablet      TAKE 1 TABLET BY MOUTH TWICE DAILY   180 tablet   0   . digoxin (LANOXIN) 0.25 MG tablet      TAKE 1 TABLET BY MOUTH EVERY DAY   90 tablet   0   . diltiazem (DILACOR XR) 240 MG 24 hr capsule      TAKE ONE CAPSULE BY MOUTH DAILY   90 capsule   0   . torsemide (DEMADEX) 20 MG tablet      TAKE 1 TABLET BY MOUTH EVERY DAY   90 tablet   0    BP 112/43  Pulse 50  Temp(Src) 98.1 F (36.7  C) (Oral)  Resp 18  SpO2 95% Physical Exam  Nursing note and vitals reviewed. Constitutional: He is oriented to person, place, and time. He appears well-developed and well-nourished.  Morbidly obese  HENT:  Head: Normocephalic and atraumatic.  Eyes: EOM are normal. Pupils are equal, round, and reactive to light.  Neck: Normal range of motion. Neck supple.  Cardiovascular: Normal heart sounds and intact distal pulses.  An irregular rhythm present. Bradycardia present.   Pulmonary/Chest: Effort normal and breath sounds normal. No respiratory distress. He has no wheezes. He has no rales.  Abdominal: Bowel sounds are normal. He exhibits no distension. There is no tenderness. There is no rebound.  Musculoskeletal: Normal range of motion. He exhibits edema (1+ edema bilateral LE). He exhibits no tenderness.  Neurological: He is alert and oriented to person, place, and time. He has normal strength. No cranial nerve deficit or sensory deficit.  Skin: Skin is warm and dry. No rash noted.  Psychiatric: He has a normal mood and affect.    ED Course  Procedures (including critical care time) Labs Review Labs Reviewed  CBC WITH DIFFERENTIAL - Abnormal; Notable for the following:    Hemoglobin 12.5 (*)    HCT 37.3 (*)  All other components within normal limits  BASIC METABOLIC PANEL - Abnormal; Notable for the following:    Glucose, Bld 158 (*)    GFR calc non Af Amer 69 (*)    GFR calc Af Amer 80 (*)    All other components within normal limits  PRO B NATRIURETIC PEPTIDE - Abnormal; Notable for the following:    Pro B Natriuretic peptide (BNP) 515.9 (*)    All other components within normal limits  TROPONIN I  DIGOXIN LEVEL   Imaging Review Dg Chest 2 View  07/11/2013   CLINICAL DATA:  Dyspnea with history of hypertension and CHF.  EXAM: CHEST  2 VIEW  COMPARISON:  November 03, 2009.  FINDINGS: The cardiac silhouette is enlarged. The pulmonary vascularity is not engorged. There is no  pleural effusion. There is no pneumothorax or pneumomediastinum. There are coarse lung markings which project over the lower thoracic spine which likely line on the right. These findings suggest atelectasis or developing pneumonia. There is mild tortuosity of the descending thoracic aorta. The observed portions of the bony thorax exhibit no acute abnormalities. .  IMPRESSION: 1. The cardiac silhouette is chronically enlarged. No pulmonary vascular congestion is demonstrated. 2. Coarse lung markings in the retrocardiac region presumably on the right suggesting atelectasis or pneumonia. 3. Followup films following therapy would be of value to assure clearing.   Electronically Signed   By: David  [place]   On: 07/11/2013 17:47    EKG Interpretation     Ventricular Rate:  57 PR Interval:    QRS Duration: 117 QT Interval:  424 QTC Calculation: 413 R Axis:   59 Text Interpretation:  Atrial fibrillation Nonspecific intraventricular conduction delay Low voltage, precordial leads Consider anterior infarct Borderline repolarization abnormality Since last tracing Rate slower Atrial fibrillation is still present Otherwise no significant change            MDM   1. Dyspnea      Labs and imaging reviewed and unremarkable. Pt with no clinical signs of CHF. Ambulating without dyspnea or hypoxia. Had brief episode of hypoxia reported by EMT while in bed ?sleep apnea but none while awake and talking. He is ready to go home.     Justin Meisenheimer B. Bernette Mayers, MD 07/11/13 2223

## 2013-07-11 NOTE — ED Notes (Signed)
Pt 97% on RA while ambulating in hallway. 

## 2013-07-11 NOTE — ED Notes (Signed)
Pt. Went to park his car and became diphoretic and sob.  The episode lasted 1/2 hour.  Presently , he denies any sob or chest pain.  Pt. Is having only nausea.  Pt. Is alert and oriented X4

## 2013-07-11 NOTE — ED Notes (Signed)
Reported to Dr. Bernette Mayers, pt.'s irregular HR

## 2014-06-28 ENCOUNTER — Emergency Department (HOSPITAL_COMMUNITY)
Admission: EM | Admit: 2014-06-28 | Discharge: 2014-06-28 | Disposition: A | Discharge status: Home / Self Care | Payer: Medicare HMO | Attending: Emergency Medicine | Admitting: Emergency Medicine

## 2014-06-28 ENCOUNTER — Emergency Department (HOSPITAL_COMMUNITY): Payer: Medicare HMO

## 2014-06-28 ENCOUNTER — Encounter (HOSPITAL_COMMUNITY): Payer: Self-pay | Admitting: Emergency Medicine

## 2014-06-28 DIAGNOSIS — I10 Essential (primary) hypertension: Secondary | ICD-10-CM | POA: Insufficient documentation

## 2014-06-28 DIAGNOSIS — I482 Chronic atrial fibrillation: Secondary | ICD-10-CM | POA: Insufficient documentation

## 2014-06-28 DIAGNOSIS — I509 Heart failure, unspecified: Secondary | ICD-10-CM | POA: Diagnosis not present

## 2014-06-28 DIAGNOSIS — Z9889 Other specified postprocedural states: Secondary | ICD-10-CM | POA: Diagnosis not present

## 2014-06-28 DIAGNOSIS — Z7901 Long term (current) use of anticoagulants: Secondary | ICD-10-CM | POA: Insufficient documentation

## 2014-06-28 DIAGNOSIS — R61 Generalized hyperhidrosis: Secondary | ICD-10-CM | POA: Diagnosis not present

## 2014-06-28 DIAGNOSIS — R079 Chest pain, unspecified: Secondary | ICD-10-CM | POA: Diagnosis not present

## 2014-06-28 DIAGNOSIS — Z8719 Personal history of other diseases of the digestive system: Secondary | ICD-10-CM | POA: Diagnosis not present

## 2014-06-28 DIAGNOSIS — E663 Overweight: Secondary | ICD-10-CM | POA: Insufficient documentation

## 2014-06-28 DIAGNOSIS — R0602 Shortness of breath: Secondary | ICD-10-CM | POA: Diagnosis present

## 2014-06-28 DIAGNOSIS — Z85820 Personal history of malignant melanoma of skin: Secondary | ICD-10-CM | POA: Diagnosis not present

## 2014-06-28 DIAGNOSIS — Z8669 Personal history of other diseases of the nervous system and sense organs: Secondary | ICD-10-CM | POA: Diagnosis not present

## 2014-06-28 DIAGNOSIS — Z79899 Other long term (current) drug therapy: Secondary | ICD-10-CM | POA: Diagnosis not present

## 2014-06-28 HISTORY — DX: Malignant melanoma of skin, unspecified: C43.9

## 2014-06-28 LAB — CBC
HCT: 40.2 % (ref 39.0–52.0)
Hemoglobin: 12.9 g/dL — ABNORMAL LOW (ref 13.0–17.0)
MCH: 26.9 pg (ref 26.0–34.0)
MCHC: 32.1 g/dL (ref 30.0–36.0)
MCV: 83.8 fL (ref 78.0–100.0)
PLATELETS: 207 10*3/uL (ref 150–400)
RBC: 4.8 MIL/uL (ref 4.22–5.81)
RDW: 15.9 % — ABNORMAL HIGH (ref 11.5–15.5)
WBC: 10.8 10*3/uL — ABNORMAL HIGH (ref 4.0–10.5)

## 2014-06-28 LAB — BASIC METABOLIC PANEL
Anion gap: 18 — ABNORMAL HIGH (ref 5–15)
BUN: 17 mg/dL (ref 6–23)
CALCIUM: 9.5 mg/dL (ref 8.4–10.5)
CO2: 22 mEq/L (ref 19–32)
CREATININE: 1.2 mg/dL (ref 0.50–1.35)
Chloride: 102 mEq/L (ref 96–112)
GFR calc Af Amer: 75 mL/min — ABNORMAL LOW (ref >90)
GFR, EST NON AFRICAN AMERICAN: 65 mL/min — AB (ref >90)
GLUCOSE: 171 mg/dL — AB (ref 70–99)
Potassium: 4.5 mEq/L (ref 3.7–5.3)
Sodium: 142 mEq/L (ref 137–147)

## 2014-06-28 LAB — I-STAT TROPONIN, ED
Troponin i, poc: 0 ng/mL (ref 0.00–0.08)
Troponin i, poc: 0 ng/mL (ref 0.00–0.08)

## 2014-06-28 LAB — PRO B NATRIURETIC PEPTIDE: Pro B Natriuretic peptide (BNP): 175.1 pg/mL — ABNORMAL HIGH (ref 0–125)

## 2014-06-28 NOTE — ED Provider Notes (Signed)
CSN: [id number]     Arrival date & time 06/28/14  1207 History   First MD Initiated Contact with Patient 06/28/14 1439     Chief Complaint  Patient presents with  . Chest Pain  . Shortness of Breath     (Consider location/radiation/quality/duration/timing/severity/associated sxs/prior Treatment) HPI  Eddie Davis is a 58 y.o. male who is here for evaluation of an episode of diaphoresis associated with transient lightheadedness. A diaphoresis occurred as he was sitting working on a project, in his garage. Did not involve lifting or exertion. The diaphoresis lasted about 30 minutes. 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 He had some mild chest discomfort last night, which he felt was related to moving some Over the last several days. He has chronic shortness of breath with exertion, which is unchanged, today. He had cardiac catheterization about 4 years ago. He has chronic atrial fibrillation treated with xarelto. He denies weakness, paresthesias, nausea, vomiting, cough, or change in his chronic back pain. He is taking his usual medications. There are no other known modifying factors.   Past Medical History  Diagnosis Date  . Hypertension   . Barrett's esophagus   . Sleep apnea   . A-fib   . CHF (congestive heart failure)   . Melanoma    Past Surgical History  Procedure Laterality Date  . Back surgery    . Ear surgery    . Appendectomy     No family history on file. History  Substance Use Topics  . Smoking status: Never Smoker   . Smokeless tobacco: Never Used  . Alcohol Use: No    Review of Systems  All other systems reviewed and are negative.     Allergies  Hctz  Home Medications   Prior to Admission medications   Medication Sig Start Date End Date Taking? Authorizing Provider  allopurinol (ZYLOPRIM) 100 MG tablet Take 100 mg by mouth daily.   Yes Historical Provider, MD   carvedilol (COREG) 12.5 MG tablet Take 12.5 mg by mouth 2 (two) times daily with a meal.   Yes Historical Provider, MD  digoxin (LANOXIN) 0.25 MG tablet Take 0.25 mg by mouth daily.   Yes Historical Provider, MD  diltiazem (DILACOR XR) 240 MG 24 hr capsule Take 240 mg by mouth daily.   Yes Historical Provider, MD  Esomeprazole Magnesium (NEXIUM PO) Take 40 mg by mouth daily.   Yes Historical Provider, MD  olmesartan (BENICAR) 20 MG tablet Take 20 mg by mouth daily.   Yes Historical Provider, MD  Rivaroxaban (XARELTO) 20 MG TABS tablet Take 20 mg by mouth daily with supper.   Yes Historical Provider, MD  sitaGLIPtin-metformin (JANUMET) 50-1000 MG per tablet Take 1 tablet by mouth 2 (two) times daily with a meal.   Yes Historical Provider, MD  spironolactone (ALDACTONE) 25 MG tablet Take 25 mg by mouth daily.   Yes Historical Provider, MD   BP 133/74  Pulse 89  Temp(Src) 98 F (36.7 C) (Oral)  Resp 17  Ht 6\' 4"  (1.93 m)  Wt 400 lb (181.439 kg)  BMI 48.71 kg/m2  SpO2 92% Physical Exam  Nursing note and vitals reviewed. Constitutional: He is oriented to person, place, and time. He appears well-developed.  Overweight  HENT:  Head: Normocephalic and atraumatic.  Right Ear: External ear normal.  Left Ear: External ear normal.  Eyes: Conjunctivae and EOM are normal. Pupils are  equal, round, and reactive to light.  Neck: Normal range of motion and phonation normal. Neck supple.  Cardiovascular: Normal rate, regular rhythm and normal heart sounds.   Pulmonary/Chest: Effort normal and breath sounds normal. No respiratory distress. He has no wheezes. He exhibits no tenderness and no bony tenderness.  Abdominal: Soft. There is no tenderness.  Musculoskeletal: Normal range of motion.  Neurological: He is alert and oriented to person, place, and time. No cranial nerve deficit or sensory deficit. He exhibits normal muscle tone. Coordination normal.  Skin: Skin is warm, dry and intact.   Psychiatric: He has a normal mood and affect. His behavior is normal. Judgment and thought content normal.    ED Course  Procedures (including critical care time) Medications - No data to display  Patient Vitals for the past 24 hrs:  BP Temp Temp src Pulse Resp SpO2 Height Weight  06/28/14 1609 133/74 mmHg 98 F (36.7 C) Oral 89 17 92 % - -  06/28/14 1519 135/71 mmHg - - 89 21 96 % - -  06/28/14 1347 123/78 mmHg - - 90 12 98 % - -  06/28/14 1344 117/62 mmHg - - 89 19 98 % - -  06/28/14 1215 143/72 mmHg 97.8 F (36.6 C) Oral 98 20 99 % 6\' 4"  (1.93 m) 400 lb (181.439 kg)      Labs Review Labs Reviewed  PRO B NATRIURETIC PEPTIDE - Abnormal; Notable for the following:    Pro B Natriuretic peptide (BNP) 175.1 (*)    All other components within normal limits  BASIC METABOLIC PANEL - Abnormal; Notable for the following:    Glucose, Bld 171 (*)    GFR calc non Af Amer 65 (*)    GFR calc Af Amer 75 (*)    Anion gap 18 (*)    All other components within normal limits  CBC - Abnormal; Notable for the following:    WBC 10.8 (*)    Hemoglobin 12.9 (*)    RDW 15.9 (*)    All other components within normal limits  I-STAT TROPOININ, ED  Randolm Idol, ED    Imaging Review Dg Chest 2 View  06/28/2014   CLINICAL DATA:  Dizzy and lightheaded.  EXAM: CHEST  2 VIEW  COMPARISON:  07/11/2013.  FINDINGS: The heart is mildly enlarged. There is no edema or effusion to suggest failure. No focal airspace disease is evident. The visualized soft tissues and bony thorax are unremarkable.  IMPRESSION: 1. Cardiomegaly without failure. 2. No acute cardiopulmonary disease.   Electronically Signed   By: Lawrence Santiago M.D.   On: 06/28/2014 13:46     EKG Interpretation   Date/Time:  Thursday June 28 2014 12:13:19 EDT Ventricular Rate:  93 PR Interval:    QRS Duration: 104 QT Interval:  332 QTC Calculation: 412 R Axis:   96 Text Interpretation:  Atrial fibrillation Rightward axis Low  voltage QRS  ST \\T \ T wave abnormality, consider inferior ischemia Abnormal ECG Since  last tracing rate faster and ST abnormality is more pronounced Confirmed  by Eulis Foster  MD, Toy Samarin ([id number]) on 06/28/2014 3:04:58 PM      MDM   Final diagnoses:  Diaphoresis    Nonspecific episode of diaphoresis. The chest pain episode, yesterday, was not associated with diaphoresis or lightheadedness. Doubt ACS, PE, pneumonia, or metabolic instability. He is a low risk for cardiac chest pain. He is stable for discharge with outpatient follow-up and management by his primary care doctor or cardiologist.  Nursing Notes Reviewed/ Care Coordinated Applicable Imaging Reviewed Interpretation of Laboratory Data incorporated into ED treatment  The patient appears reasonably screened and/or stabilized for discharge and I doubt any other medical condition or other [facility] requiring further screening, evaluation, or treatment in the ED at this time prior to discharge.  Plan: Home Medications- usual; Home Treatments- rest; return here if the recommended treatment, does not improve the symptoms; Recommended follow up- Cardiology 1 week for follow up care and evaluation     Richarda Blade, MD 06/28/14 1700

## 2014-06-28 NOTE — ED Notes (Signed)
Pt states he started having left sided cp earlier today. Has hx of afib. Pt takes xarelto for the afib. States he was SOB when walking into the ED. No radiation with the pain.

## 2014-06-28 NOTE — Discharge Instructions (Signed)
Diaphoresis Sweating is controlled by our nervous system. Sweat glands are found in the skin throughout the body. They exist in higher numbers in the skin of the hands, feet, armpits, and the genital region. Sweating occurs normally when the temperature of your body goes up. Diaphoresis means profuse sweating or perspiration due to an underlying medical condition or an external factor (such as medicines). Hyperhidrosis means excessive sweating that is not usually due to an underlying medical condition, on areas such as the palms, soles, or armpits. Other areas of the body may also be affected. Hyperhidrosis usually begins in childhood or early adolescence. It increases in severity through puberty and into adulthood. Sweaty palms are the most common problem and the most bothersome to people who have hyperhidrosis. CAUSES  Sweating is normally seen with exercise or being in hot surroundings. Not sweating in these conditions would be harmful. Stressful situations can also cause sweating. In some people, stimulation of the sweat glands during stress is overactive. Talking to strangers or shaking someone's hand can produce profuse sweating. Causes of sweating include:  Emotional upset.  Low blood pressure.  Low blood sugar.  Heart problems.  Low blood cell counts.  Certain pain relieving medicines.  Exercise.  Alcohol.  Infection.  Caffeine.  Spicy foods.  Hot flashes.  Overactive thyroid.  Illegal drug use, such as cocaine and amphetamine.  Use of medicines that stimulate parts of the nervous system.  A tumor (pheochromocytoma).  Withdrawal from some medicines or alcohol. DIAGNOSIS  Your caregiver needs to be consulted to make sure excessive sweating is not caused by another condition. Further testing may need to be done. TREATMENT   When hyperhidrosis is caused by another condition, that condition should be treated.  If menopause is the cause, you may wish to talk to your  caregiver about estrogen replacement.  If the hyperhidrosis is a natural happening of the way your body works, certain antiperspirants may help.  If medicines do not work, injections of botulinum toxin type A are sometimes used for underarm sweating.  Your caregiver can usually help you with this problem. Document Released: 03/09/2005 Document Revised: 11/09/2011 Document Reviewed: 09/24/2008 ExitCare Patient Information 2015 [employer]. This information is not intended to replace advice given to you by your health care provider. Make sure you discuss any questions you have with your health care provider.  

## 2014-06-28 NOTE — ED Notes (Signed)
MD at bedside. 

## 2015-02-13 ENCOUNTER — Emergency Department (HOSPITAL_COMMUNITY)
Admission: EM | Admit: 2015-02-13 | Discharge: 2015-02-13 | Disposition: A | Discharge status: Home / Self Care | Payer: Medicare HMO | Source: Home / Self Care | Attending: Family Medicine | Admitting: Family Medicine

## 2015-02-13 ENCOUNTER — Encounter (HOSPITAL_COMMUNITY): Payer: Self-pay | Admitting: Emergency Medicine

## 2015-02-13 DIAGNOSIS — Z23 Encounter for immunization: Secondary | ICD-10-CM

## 2015-02-13 DIAGNOSIS — T2020XA Burn of second degree of head, face, and neck, unspecified site, initial encounter: Secondary | ICD-10-CM | POA: Diagnosis not present

## 2015-02-13 MED ORDER — TETANUS-DIPHTH-ACELL PERTUSSIS 5-2.5-18.5 LF-MCG/0.5 IM SUSP
0.5000 mL | Freq: Once | INTRAMUSCULAR | Status: AC
  Administered 2015-02-13: 0.5 mL via INTRAMUSCULAR

## 2015-02-13 MED ORDER — TETANUS-DIPHTH-ACELL PERTUSSIS 5-2.5-18.5 LF-MCG/0.5 IM SUSP
INTRAMUSCULAR | Status: AC
  Filled 2015-02-13: qty 0.5

## 2015-02-13 MED ORDER — SILVER SULFADIAZINE 1 % EX CREA
TOPICAL_CREAM | CUTANEOUS | Status: AC
  Filled 2015-02-13: qty 85

## 2015-02-13 MED ORDER — SILVER SULFADIAZINE 1 % EX CREA: 1.0000 "application " | TOPICAL_CREAM | Freq: Three times a day (TID) | CUTANEOUS | Status: AC
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
## 2018-02-05 ENCOUNTER — Encounter (HOSPITAL_COMMUNITY): Payer: Self-pay | Admitting: Emergency Medicine

## 2018-02-05 ENCOUNTER — Emergency Department (HOSPITAL_COMMUNITY): Payer: Medicare HMO

## 2018-02-05 ENCOUNTER — Emergency Department (HOSPITAL_COMMUNITY)
Admission: EM | Admit: 2018-02-05 | Discharge: 2018-02-05 | Disposition: A | Discharge status: Home / Self Care | Payer: Medicare HMO | Attending: Emergency Medicine | Admitting: Emergency Medicine

## 2018-02-05 ENCOUNTER — Other Ambulatory Visit: Payer: Self-pay

## 2018-02-05 DIAGNOSIS — I4819 Other persistent atrial fibrillation: Secondary | ICD-10-CM

## 2018-02-05 DIAGNOSIS — I481 Persistent atrial fibrillation: Secondary | ICD-10-CM | POA: Insufficient documentation

## 2018-02-05 DIAGNOSIS — I110 Hypertensive heart disease with heart failure: Secondary | ICD-10-CM | POA: Insufficient documentation

## 2018-02-05 DIAGNOSIS — D500 Iron deficiency anemia secondary to blood loss (chronic): Secondary | ICD-10-CM | POA: Diagnosis not present

## 2018-02-05 DIAGNOSIS — I509 Heart failure, unspecified: Secondary | ICD-10-CM | POA: Diagnosis not present

## 2018-02-05 DIAGNOSIS — Z79899 Other long term (current) drug therapy: Secondary | ICD-10-CM | POA: Insufficient documentation

## 2018-02-05 DIAGNOSIS — M5412 Radiculopathy, cervical region: Secondary | ICD-10-CM | POA: Diagnosis not present

## 2018-02-05 DIAGNOSIS — R200 Anesthesia of skin: Secondary | ICD-10-CM | POA: Diagnosis not present

## 2018-02-05 DIAGNOSIS — Z7901 Long term (current) use of anticoagulants: Secondary | ICD-10-CM | POA: Insufficient documentation

## 2018-02-05 DIAGNOSIS — R079 Chest pain, unspecified: Secondary | ICD-10-CM | POA: Diagnosis present

## 2018-02-05 LAB — FOLATE: Folate: 13.8 ng/mL (ref >5.9)

## 2018-02-05 LAB — BASIC METABOLIC PANEL
ANION GAP: 9 (ref 5–15)
BUN: 12 mg/dL (ref 6–20)
CALCIUM: 9 mg/dL (ref 8.9–10.3)
CHLORIDE: 105 mmol/L (ref 101–111)
CO2: 25 mmol/L (ref 22–32)
Creatinine, Ser: 1.16 mg/dL (ref 0.61–1.24)
GFR calc non Af Amer: >60 mL/min (ref >60)
Glucose, Bld: 100 mg/dL — ABNORMAL HIGH (ref 65–99)
Potassium: 4.6 mmol/L (ref 3.5–5.1)
SODIUM: 139 mmol/L (ref 135–145)

## 2018-02-05 LAB — PREPARE RBC (CROSSMATCH)

## 2018-02-05 LAB — CBC
HCT: 25.6 % — ABNORMAL LOW (ref 39.0–52.0)
HEMOGLOBIN: 7 g/dL — AB (ref 13.0–17.0)
MCH: 18.8 pg — ABNORMAL LOW (ref 26.0–34.0)
MCHC: 27.3 g/dL — ABNORMAL LOW (ref 30.0–36.0)
MCV: 68.6 fL — ABNORMAL LOW (ref 78.0–100.0)
Platelets: 208 10*3/uL (ref 150–400)
RBC: 3.73 MIL/uL — ABNORMAL LOW (ref 4.22–5.81)
RDW: 19.3 % — ABNORMAL HIGH (ref 11.5–15.5)
WBC: 7.8 10*3/uL (ref 4.0–10.5)

## 2018-02-05 LAB — IRON AND TIBC
IRON: 17 ug/dL — AB (ref 45–182)
Saturation Ratios: 3 % — ABNORMAL LOW (ref 17.9–39.5)
TIBC: 521 ug/dL — ABNORMAL HIGH (ref 250–450)
UIBC: 504 ug/dL

## 2018-02-05 LAB — POC OCCULT BLOOD, ED: FECAL OCCULT BLD: NEGATIVE

## 2018-02-05 LAB — I-STAT TROPONIN, ED
TROPONIN I, POC: 0 ng/mL (ref 0.00–0.08)
Troponin i, poc: 0 ng/mL (ref 0.00–0.08)

## 2018-02-05 LAB — RETICULOCYTES
RBC.: 3.92 MIL/uL — ABNORMAL LOW (ref 4.22–5.81)
Retic Count, Absolute: 90.2 10*3/uL (ref 19.0–186.0)
Retic Ct Pct: 2.3 % (ref 0.4–3.1)

## 2018-02-05 LAB — DIGOXIN LEVEL

## 2018-02-05 LAB — ABO/RH: ABO/RH(D): A POS

## 2018-02-05 LAB — VITAMIN B12: Vitamin B-12: 358 pg/mL (ref 180–914)

## 2018-02-05 LAB — TSH: TSH: 2.705 u[IU]/mL (ref 0.350–4.500)

## 2018-02-05 LAB — FERRITIN: Ferritin: 5 ng/mL — ABNORMAL LOW (ref 24–336)

## 2018-02-05 MED ORDER — DILTIAZEM HCL 25 MG/5ML IV SOLN: 10.0000 mg | Freq: Once | INTRAVENOUS | Status: DC

## 2018-02-05 MED ORDER — SODIUM CHLORIDE 0.9 % IV SOLN
10.0000 mL/h | Freq: Once | INTRAVENOUS | Status: AC
  Administered 2018-02-05: 10 mL/h via INTRAVENOUS

## 2018-02-05 MED ORDER — FERROUS SULFATE 325 (65 FE) MG PO TABS: 325.0000 mg | ORAL_TABLET | Freq: Every day | ORAL | 0 refills | Status: AC

## 2018-02-05 MED ORDER — PANTOPRAZOLE SODIUM 40 MG PO TBEC
40.0000 mg | DELAYED_RELEASE_TABLET | Freq: Once | ORAL | Status: AC
  Administered 2018-02-05: 40 mg via ORAL
  Filled 2018-02-05: qty 1

## 2018-02-05 NOTE — ED Triage Notes (Signed)
Pt presents to ED for assessment of left arm numbness after 2 days of increasing shortness of breath, fatigue, and jitteriness.  Pt is taking a new medication for weightloss x 2 weeks, and decided to stop taking it 2 days ago when the symptoms started.  Pt states dry mouth as well.  C/o 4/10 pain to left armpit

## 2018-02-05 NOTE — ED Provider Notes (Signed)
[facility] EMERGENCY DEPARTMENT Provider Note   CSN: [id number] Arrival date & time: 02/05/18  1320     History   Chief Complaint Chief Complaint  Patient presents with  . Chest Pain  . Shortness of Breath    HPI Eddie Davis is a 62 y.o. male hx of afib on xarelto and digoxin, CHF, HTN, here with chest pain, SOB, L arm numbness. Patient states that he started on weight loss pill as prescribed by his doctor 2 weeks ago. Since then he has been having dizziness and headaches so stopped it 2 days ago. For the last 2 days, he has worsening shortness of breath and left arm numbness and his dizziness persisted. He initially told triage that he has been having L arm numbness for 2 days but told me that he woke up with it this morning. Patient denies trouble speaking or weakness.   The history is provided by the patient.    Past Medical History:  Diagnosis Date  . A-fib ([place])   . Barrett's esophagus   . CHF (congestive heart failure) ([place])   . Hypertension   . Melanoma ([place])   . Sleep apnea     Patient Active Problem List   Diagnosis Date Noted  . GOUT 08/06/2008  . OBESITY-MORBID (>100') 08/06/2008  . HEARING LOSS, UNSPEC. 08/06/2008  . HYPERTENSION, BENIGN 08/06/2008  . C A D 08/06/2008  . ATRIAL FLUTTER 08/06/2008  . DIASTOLIC HEART FAILURE, CHRONIC 08/06/2008  . GERD 08/06/2008  . SLEEP APNEA 08/06/2008    Past Surgical History:  Procedure Laterality Date  . APPENDECTOMY    . BACK SURGERY    . ear surgery          Home Medications    Prior to Admission medications   Medication Sig Start Date End Date Taking? Authorizing Provider  allopurinol (ZYLOPRIM) 100 MG tablet Take 100 mg by mouth daily.    [provider]  carvedilol (COREG) 12.5 MG tablet Take 12.5 mg by mouth 2 (two) times daily with a meal.    [provider]  digoxin (LANOXIN) 0.25 MG tablet Take 0.25 mg by mouth daily.    [provider]  diltiazem  (DILACOR XR) 240 MG 24 hr capsule Take 240 mg by mouth daily.    [provider]  Esomeprazole Magnesium (NEXIUM PO) Take 40 mg by mouth daily.    [provider]  olmesartan (BENICAR) 20 MG tablet Take 20 mg by mouth daily.    [provider]  Rivaroxaban (XARELTO) 20 MG TABS tablet Take 20 mg by mouth daily with supper.    [provider]  silver sulfADIAZINE (SILVADENE) 1 % cream Apply 1 application topically 3 (three) times daily. After thoroughly washing face 02/13/15   Billy Fischer, MD  sitaGLIPtin-metformin (JANUMET) 50-1000 MG per tablet Take 1 tablet by mouth 2 (two) times daily with a meal.    [provider]  spironolactone (ALDACTONE) 25 MG tablet Take 25 mg by mouth daily.    [provider]    Family History History reviewed. No pertinent family history.  Social History Social History   Tobacco Use  . Smoking status: Never Smoker  . Smokeless tobacco: Never Used  Substance Use Topics  . Alcohol use: No  . Drug use: No     Allergies   Hctz [hydrochlorothiazide]   Review of Systems Review of Systems  Respiratory: Positive for shortness of breath.   Neurological: Positive for  numbness.  All other systems reviewed and are negative.    Physical Exam Updated Vital Signs BP (!) 144/95   Pulse 93   Temp 97.7 F (36.5 C) (Oral)   Resp 18   Ht 6' 3.5" (1.918 m)   Wt (!) 175.5 kg (387 lb)   SpO2 100%   BMI 47.73 kg/m   Physical Exam  Constitutional: He is oriented to person, place, and time.  Chronically ill   HENT:  Head: Normocephalic.  Eyes: Pupils are equal, round, and reactive to light.  Neck: Normal range of motion.  Cardiovascular: Regular rhythm and normal pulses.  Pulmonary/Chest: Effort normal and breath sounds normal.  Abdominal: Soft. Bowel sounds are normal.  Musculoskeletal: Normal range of motion.       Right lower leg: Normal.       Left lower leg: Normal.  Neurological: He is alert  and oriented to person, place, and time.  CN 2-12 intact. Nl strength and sensation throughout. Nl finger to nose. No pronator drift.   Skin: Skin is warm. Capillary refill takes less than 2 seconds.  Psychiatric: He has a normal mood and affect.  Nursing note and vitals reviewed.    ED Treatments / Results  Labs (all labs ordered are listed, but only abnormal results are displayed) Labs Reviewed  BASIC METABOLIC PANEL - Abnormal; Notable for the following components:      Result Value   Glucose, Bld 100 (*)    All other components within normal limits  CBC - Abnormal; Notable for the following components:   RBC 3.73 (*)    Hemoglobin 7.0 (*)    HCT 25.6 (*)    MCV 68.6 (*)    MCH 18.8 (*)    MCHC 27.3 (*)    RDW 19.3 (*)    All other components within normal limits  RETICULOCYTES - Abnormal; Notable for the following components:   RBC. 3.92 (*)    All other components within normal limits  TSH  DIGOXIN LEVEL  VITAMIN B12  FOLATE  IRON AND TIBC  FERRITIN  I-STAT TROPONIN, ED  POC OCCULT BLOOD, ED  PREPARE RBC (CROSSMATCH)  TYPE AND SCREEN  ABO/RH    EKG EKG Interpretation  Date/Time:  Saturday February 05 2018 15:08:11 EDT Ventricular Rate:  100 PR Interval:    QRS Duration: 107 QT Interval:  351 QTC Calculation: 453 R Axis:   99 Text Interpretation:  Atrial fibrillation Right axis deviation Low voltage, precordial leads Consider anterior infarct No significant change since last tracing Confirmed by Wandra Arthurs [id number]) on 02/05/2018 3:21:58 PM   Radiology Dg Chest 2 View  Result Date: 02/05/2018 CLINICAL DATA:  Shortness of breath and left arm numbness for 2 days. EXAM: CHEST - 2 VIEW COMPARISON:  June 28, 2014 FINDINGS: The heart size and mediastinal contours are stable. Both lungs are clear. The visualized skeletal structures are unremarkable. IMPRESSION: No active cardiopulmonary disease. Electronically Signed   By: Abelardo Diesel M.D.   On: 02/05/2018 14:23     Procedures Procedures (including critical care time)  Medications Ordered in ED Medications  0.9 %  sodium chloride infusion (10 mL/hr Intravenous New Bag/Given 02/05/18 1605)  pantoprazole (PROTONIX) EC tablet 40 mg (40 mg Oral Given 02/05/18 1605)     Initial Impression / Assessment and Plan / ED Course  I have reviewed the triage vital signs and the nursing notes.  Pertinent labs & imaging results that were available during my care of the  patient were reviewed by me and considered in my medical decision making (see chart for details).    Eddie Davis is a 62 y.o. male here with shortness of breath, L arm numbness, weakness. Patient did take a weight loss pill for the last 2 weeks and can get electrolyte abnormality or hyperthyroidism from that. Also consider cervical radiculopathy vs stroke. He has no numbness or weakness currently and symptoms for about a day or so. Will get CT head/neck and if negative, won't need MRI. Also consider ACS so will get trop x 2. Will get labs as well.    4pm His Hg is 7. Baseline around 12. Occ negative. Will send anemia panel. I suspect iron deficiency or slow GI bleed from xarelto. I ordered 1 U PRBC for symptomatic anemia.   9:17 PM Anemia panel showed low iron. 1 U PRBC done. Patient felt better and observed for an hour after transfusion and has no reactions. CT head unremarkable. CT cervical spine showed degenerative disc disease likely causing his radiulopathy to L arm. Delta trop neg. Of note, patient's HR is around 115, afib. Patient has known afib and didn't take his cardizem this afternoon. Offered cardizem but he states that he can only take cardizem from one manufacturer so would rather take it at home.     Final Clinical Impressions(s) / ED Diagnoses   Final diagnoses:  None    ED Discharge Orders    None       Drenda Freeze, MD 02/05/18 2119

## 2018-02-05 NOTE — Discharge Instructions (Addendum)
Take iron pills as prescribed.   Continue taking nexium.   You need to see your doctor in a week to recheck your hemoglobin (it was 7 in the ED today). Consider follow up with GI doctor if your blood count is still low.   You have a pinched nerve in your neck causing your left arm numbness. Consider physical therapy with your doctor. If it doesn't help, consider referral to spine specialist   Return to ER if you have worse shortness of breath, arm numbness and weakness, trouble speaking, chest pain, palpitations

## 2018-02-05 NOTE — ED Notes (Signed)
Pt returns from radiology. 

## 2018-02-05 NOTE — ED Notes (Signed)
Patient verbalizes understanding of discharge instructions. Opportunity for questioning and answers were provided. Armband removed by staff, pt discharged from ED.  

## 2018-02-06 LAB — BPAM RBC
Blood Product Expiration Date: 201907022359
ISSUE DATE / TIME: 201906081652
Unit Type and Rh: 6200

## 2018-02-06 LAB — TYPE AND SCREEN
ABO/RH(D): A POS
Antibody Screen: NEGATIVE
UNIT DIVISION: 0

## 2019-11-16 ENCOUNTER — Ambulatory Visit: Payer: Medicare HMO | Attending: Internal Medicine

## 2019-11-16 DIAGNOSIS — Z23 Encounter for immunization: Secondary | ICD-10-CM

## 2019-11-16 NOTE — Progress Notes (Signed)
   Covid-19 Vaccination Clinic  Name:  Eddie Davis    MRN: [id number] DOB: August 01, 1956  11/16/2019  Eddie Davis was observed post Covid-19 immunization for 30 minutes based on pre-vaccination screening without incident. He was provided with Vaccine Information Sheet and instruction to access the V-Safe system.   Eddie Davis was instructed to call 911 with any severe reactions post vaccine: Marland Kitchen Difficulty breathing  . Swelling of face and throat  . A fast heartbeat  . A bad rash all over body  . Dizziness and weakness   Immunizations Administered    Name Date Dose VIS Date Route   Pfizer COVID-19 Vaccine 11/16/2019 11:29 AM 0.3 mL 08/11/2019 Intramuscular   Manufacturer: [place]   Lot: [id number]   [place]: [id number]

## 2019-12-11 ENCOUNTER — Ambulatory Visit: Payer: Medicare HMO | Attending: Internal Medicine

## 2019-12-11 DIAGNOSIS — Z23 Encounter for immunization: Secondary | ICD-10-CM

## 2019-12-11 NOTE — Progress Notes (Signed)
   Covid-19 Vaccination Clinic  Name:  Eddie Davis    MRN: [id number] DOB: 1955/11/18  12/11/2019  [name] was observed post Covid-19 immunization for 15 minutes without incident. He was provided with Vaccine Information Sheet and instruction to access the V-Safe system.   [name] was instructed to call 911 with any severe reactions post vaccine: Marland Kitchen Difficulty breathing  . Swelling of face and throat  . A fast heartbeat  . A bad rash all over body  . Dizziness and weakness   Immunizations Administered    Name Date Dose VIS Date Route   Pfizer COVID-19 Vaccine 12/11/2019 10:06 AM 0.3 mL 08/11/2019 Intramuscular   Manufacturer: [place]   Lot: [id number]   [place]: [id number]

## 2020-01-24 ENCOUNTER — Encounter (HOSPITAL_COMMUNITY): Payer: Self-pay

## 2020-01-24 ENCOUNTER — Emergency Department (HOSPITAL_COMMUNITY)
Admission: EM | Admit: 2020-01-24 | Discharge: 2020-01-24 | Disposition: A | Discharge status: Home / Self Care | Payer: Medicare HMO | Attending: Emergency Medicine | Admitting: Emergency Medicine

## 2020-01-24 ENCOUNTER — Other Ambulatory Visit: Payer: Self-pay

## 2020-01-24 DIAGNOSIS — E669 Obesity, unspecified: Secondary | ICD-10-CM | POA: Diagnosis not present

## 2020-01-24 DIAGNOSIS — Z79899 Other long term (current) drug therapy: Secondary | ICD-10-CM | POA: Diagnosis not present

## 2020-01-24 DIAGNOSIS — Z7901 Long term (current) use of anticoagulants: Secondary | ICD-10-CM | POA: Diagnosis not present

## 2020-01-24 DIAGNOSIS — R42 Dizziness and giddiness: Secondary | ICD-10-CM | POA: Insufficient documentation

## 2020-01-24 DIAGNOSIS — I5032 Chronic diastolic (congestive) heart failure: Secondary | ICD-10-CM | POA: Diagnosis not present

## 2020-01-24 DIAGNOSIS — I4891 Unspecified atrial fibrillation: Secondary | ICD-10-CM | POA: Diagnosis not present

## 2020-01-24 DIAGNOSIS — R600 Localized edema: Secondary | ICD-10-CM | POA: Insufficient documentation

## 2020-01-24 DIAGNOSIS — R110 Nausea: Secondary | ICD-10-CM | POA: Diagnosis not present

## 2020-01-24 DIAGNOSIS — I110 Hypertensive heart disease with heart failure: Secondary | ICD-10-CM | POA: Diagnosis not present

## 2020-01-24 DIAGNOSIS — Z6841 Body Mass Index (BMI) 40.0 and over, adult: Secondary | ICD-10-CM | POA: Insufficient documentation

## 2020-01-24 LAB — CBC
HCT: 42 % (ref 39.0–52.0)
Hemoglobin: 12.4 g/dL — ABNORMAL LOW (ref 13.0–17.0)
MCH: 25.8 pg — ABNORMAL LOW (ref 26.0–34.0)
MCHC: 29.5 g/dL — ABNORMAL LOW (ref 30.0–36.0)
MCV: 87.5 fL (ref 80.0–100.0)
Platelets: 149 10*3/uL — ABNORMAL LOW (ref 150–400)
RBC: 4.8 MIL/uL (ref 4.22–5.81)
RDW: 16.5 % — ABNORMAL HIGH (ref 11.5–15.5)
WBC: 8.3 10*3/uL (ref 4.0–10.5)
nRBC: 0 % (ref 0.0–0.2)

## 2020-01-24 LAB — BASIC METABOLIC PANEL
Anion gap: 10 (ref 5–15)
BUN: 24 mg/dL — ABNORMAL HIGH (ref 8–23)
CO2: 24 mmol/L (ref 22–32)
Calcium: 9.7 mg/dL (ref 8.9–10.3)
Chloride: 104 mmol/L (ref 98–111)
Creatinine, Ser: 1.3 mg/dL — ABNORMAL HIGH (ref 0.61–1.24)
GFR calc Af Amer: >60 mL/min (ref >60)
GFR calc non Af Amer: 58 mL/min — ABNORMAL LOW (ref >60)
Glucose, Bld: 171 mg/dL — ABNORMAL HIGH (ref 70–99)
Potassium: 4.8 mmol/L (ref 3.5–5.1)
Sodium: 138 mmol/L (ref 135–145)

## 2020-01-24 LAB — MAGNESIUM: Magnesium: 1.6 mg/dL — ABNORMAL LOW (ref 1.7–2.4)

## 2020-01-24 LAB — DIGOXIN LEVEL: Digoxin Level: <0.2 ng/mL — ABNORMAL LOW (ref 0.8–2.0)

## 2020-01-24 MED ORDER — ONDANSETRON HCL 4 MG PO TABS
4.0000 mg | ORAL_TABLET | Freq: Four times a day (QID) | ORAL | 0 refills | Status: AC
Start: 2020-01-24 — End: ?

## 2020-01-24 MED ORDER — SODIUM CHLORIDE 0.9 % IV BOLUS
500.0000 mL | Freq: Once | INTRAVENOUS | Status: AC
  Administered 2020-01-24: 500 mL via INTRAVENOUS

## 2020-01-24 MED ORDER — ONDANSETRON HCL 4 MG/2ML IJ SOLN
4.0000 mg | Freq: Once | INTRAMUSCULAR | Status: AC
  Administered 2020-01-24: 4 mg via INTRAVENOUS
  Filled 2020-01-24: qty 2

## 2020-01-24 NOTE — ED Notes (Signed)
Pt able to hold down water, no endorsement of N/V

## 2020-01-24 NOTE — Discharge Instructions (Signed)
You are seen in the emergency department for evaluation of nausea and lightheadedness.  You were concerned that you were anemic again.  Your red blood cell count was stable.  Your symptoms improved with some nausea medication and fluids.  Please follow-up with your regular doctors as scheduled.  Return to the emergency department if any worsening or concerning symptoms

## 2020-01-24 NOTE — ED Triage Notes (Signed)
Pt c.o dizziness and nausea this morning. Hx of similar symptoms and states he needed a blood transfusion last time. Pt appears pale, denies chest pain or SOB

## 2020-01-24 NOTE — ED Provider Notes (Signed)
[facility] Provider Note   CSN: [id number] Arrival date & time: 01/24/20  1014     History Chief Complaint  Patient presents with  . Dizziness  . Nausea    Eddie Davis is a 64 y.o. male.  He has a history of chronic A. fib CHF hypertension and is on anticoagulation.  He said he woke up this morning feeling nauseous and dizzy/lightheaded.  He said the last time this happened he needed a blood transfusion.  He denies seeing any melena.  No vomiting.  No fevers chills chest pain shortness of breath abdominal pain.  No sick contacts or recent travel.  The history is provided by the patient.  Dizziness Quality:  Lightheadedness Severity:  Moderate Onset quality:  Gradual Progression:  Resolved Chronicity:  Recurrent Context: standing up   Relieved by:  Nothing Worsened by:  Standing up Ineffective treatments:  None tried Associated symptoms: nausea   Associated symptoms: no blood in stool, no chest pain, no diarrhea, no headaches, no shortness of breath, no syncope, no vision changes, no vomiting and no weakness   Risk factors: anemia        Past Medical History:  Diagnosis Date  . A-fib ([place])   . Barrett's esophagus   . CHF (congestive heart failure) ([place])   . Hypertension   . Melanoma ([place])   . Sleep apnea     Patient Active Problem List   Diagnosis Date Noted  . GOUT 08/06/2008  . OBESITY-MORBID (>100') 08/06/2008  . HEARING LOSS, UNSPEC. 08/06/2008  . HYPERTENSION, BENIGN 08/06/2008  . C A D 08/06/2008  . ATRIAL FLUTTER 08/06/2008  . DIASTOLIC HEART FAILURE, CHRONIC 08/06/2008  . GERD 08/06/2008  . SLEEP APNEA 08/06/2008    Past Surgical History:  Procedure Laterality Date  . APPENDECTOMY    . BACK SURGERY    . ear surgery         No family history on file.  Social History   Tobacco Use  . Smoking status: Never Smoker  . Smokeless tobacco: Never Used  Substance Use Topics  . Alcohol use: No  . Drug  use: No    Home Medications Prior to Admission medications   Medication Sig Start Date End Date Taking? Authorizing Provider  allopurinol (ZYLOPRIM) 100 MG tablet Take 100 mg by mouth daily.    [provider]  atorvastatin (LIPITOR) 40 MG tablet Take 40 mg by mouth daily. 01/14/18   [provider]  carvedilol (COREG) 12.5 MG tablet Take 12.5 mg by mouth 2 (two) times daily with a meal.    [provider]  digoxin (LANOXIN) 0.25 MG tablet Take 0.25 mg by mouth daily.    [provider]  diltiazem (CARDIZEM) 120 MG tablet Take 120 mg by mouth 2 (two) times daily.     [provider]  Esomeprazole Magnesium (NEXIUM 24HR) 20 MG TBEC Take 20 mg by mouth daily.    [provider]  ferrous sulfate 325 (65 FE) MG tablet Take 1 tablet (325 mg total) by mouth daily. 02/05/18   Drenda Freeze, MD  metFORMIN (GLUCOPHAGE) 1000 MG tablet Take 1,000 mg by mouth 2 (two) times daily. 01/14/18   [provider]  Rivaroxaban (XARELTO) 20 MG TABS tablet Take 20 mg by mouth daily.     [provider]  silver sulfADIAZINE (SILVADENE) 1 % cream Apply 1 application topically 3 (three) times daily. After thoroughly washing face Patient not taking: Reported  on 02/05/2018 02/13/15   Billy Fischer, MD  spironolactone (ALDACTONE) 25 MG tablet Take 25 mg by mouth daily.    [provider]  torsemide (DEMADEX) 20 MG tablet Take 20 mg by mouth daily as needed (for fluid or edema).  01/03/18   [provider]  traZODone (DESYREL) 50 MG tablet Take 25-50 mg by mouth at bedtime as needed for sleep.  12/31/17   [provider]    Allergies    Belviq [lorcaserin], Furosemide, and Hctz [hydrochlorothiazide]  Review of Systems   Review of Systems  Constitutional: Negative for fever.  HENT: Negative for sore throat.   Eyes: Negative for visual disturbance.  Respiratory: Negative for shortness of breath.   Cardiovascular: Negative  for chest pain and syncope.  Gastrointestinal: Positive for nausea. Negative for abdominal pain, blood in stool, diarrhea and vomiting.  Genitourinary: Negative for dysuria.  Musculoskeletal: Negative for gait problem.  Skin: Negative for rash.  Neurological: Positive for dizziness and light-headedness. Negative for weakness and headaches.    Physical Exam Updated Vital Signs BP (!) 142/83 (BP Location: Right Arm)   Pulse 88   Temp 98.7 F (37.1 C) (Oral)   Resp 18   Ht 6\' 3"  (1.905 m)   Wt (!) 176.4 kg   SpO2 99%   BMI 48.62 kg/m   Physical Exam Vitals and nursing note reviewed.  Constitutional:      Appearance: Normal appearance. He is well-developed. He is obese.  HENT:     Head: Normocephalic and atraumatic.  Eyes:     Conjunctiva/sclera: Conjunctivae normal.  Cardiovascular:     Rate and Rhythm: Normal rate. Rhythm irregular.     Heart sounds: No murmur.  Pulmonary:     Effort: Pulmonary effort is normal. No respiratory distress.     Breath sounds: Normal breath sounds.  Abdominal:     Palpations: Abdomen is soft.     Tenderness: There is no abdominal tenderness. There is no guarding or rebound.  Musculoskeletal:        General: No tenderness.     Cervical back: Neck supple.     Right lower leg: Edema present.     Left lower leg: Edema present.  Skin:    General: Skin is warm and dry.     Capillary Refill: Capillary refill takes less than 2 seconds.  Neurological:     General: No focal deficit present.     Mental Status: He is alert.     Sensory: No sensory deficit.     Motor: No weakness.     ED Results / Procedures / Treatments   Labs (all labs ordered are listed, but only abnormal results are displayed) Labs Reviewed  BASIC METABOLIC PANEL - Abnormal; Notable for the following components:      Result Value   Glucose, Bld 171 (*)    BUN 24 (*)    Creatinine, Ser 1.30 (*)    GFR calc non Af Amer 58 (*)    All other components within normal limits    CBC - Abnormal; Notable for the following components:   Hemoglobin 12.4 (*)    MCH 25.8 (*)    MCHC 29.5 (*)    RDW 16.5 (*)    Platelets 149 (*)    All other components within normal limits  DIGOXIN LEVEL - Abnormal; Notable for the following components:   Digoxin Level <0.2 (*)    All other components within normal limits  MAGNESIUM - Abnormal;  Notable for the following components:   Magnesium 1.6 (*)    All other components within normal limits    EKG EKG Interpretation  Date/Time:  Wednesday Jan 24 2020 10:19:47 EDT Ventricular Rate:  85 PR Interval:    QRS Duration: 110 QT Interval:  358 QTC Calculation: 426 R Axis:   30 Text Interpretation: Atrial fibrillation with premature ventricular or aberrantly conducted complexes Abnormal ECG increased pvc from prior 6/19 Confirmed by Aletta Edouard [id number]) on 01/24/2020 4:01:49 PM   Radiology No results found.  Procedures Procedures (including critical care time)  Medications Ordered in ED Medications  ondansetron (ZOFRAN) injection 4 mg (has no administration in time range)  sodium chloride 0.9 % bolus 500 mL (has no administration in time range)    ED Course  I have reviewed the triage vital signs and the nursing notes.  Pertinent labs & imaging results that were available during my care of the patient were reviewed by me and considered in my medical decision making (see chart for details).  Clinical Course as of Jan 24 825  Wed Jan 24, 2020  1934 Patient states he feels better wants to go home.  Orthostatics were done and unremarkable.  Magnesium mildly low at 1.6.  Dig undetectable.  Creatinine elevated but baseline.  Hemoglobin stable.  Nausea resolved.   [MB]    Clinical Course User Index [MB] Hayden Rasmussen, MD   MDM Rules/Calculators/A&P                     This patient complains of lightheadedness and nausea; this involves an extensive number of treatment Options and is a complaint that carries with  it a high risk of complications and Morbidity. The differential includes anemia, metabolic derangement, ACS, dehydration, arrhythmia  I ordered, reviewed and interpreted labs, which included normal white count, stable hemoglobin, normal chemistries other than elevated glucose in the setting of a diabetic, mild elevation of creatinine.  Magnesium slightly low 1.6 and digoxin undetectable.  Order digoxin to make sure he was not dig toxic. I ordered medication IV fluids and Zofran with improvement in his symptoms Previous records obtained and reviewed in epic including last admission where he needed transfusion for globin of 7  After the interventions stated above, I reevaluated the patient and found patient to be hemodynamically stable.  He said he is feeling better and just wanted to be sure that he did not need a blood transfusion.  He will follow up with his cardiologist and primary care doctor.  Return instructions discussed.  CHA2DS2/VAS Stroke Risk Points  Current as of yesterday     2 >= 2 Points: High Risk  1 - 1.99 Points: Medium Risk  0 Points: Low Risk    This is the only CHA2DS2/VAS Stroke Risk Points available for the past  year.: Last Change: N/A     Details    This score determines the patient's risk of having a stroke if the  patient has atrial fibrillation.       Points Metrics  0 Has Congestive Heart Failure:  No    Current as of yesterday  1 Has Vascular Disease:  Yes    Current as of yesterday  1 Has Hypertension:  Yes    Current as of yesterday  0 Age:  72    Current as of yesterday  0 Has Diabetes:  No    Current as of yesterday  0 Had Stroke:  No  Had TIA:  No  Had thromboembolism:  No    Current as of yesterday  0 Male:  No    Current as of yesterday           Final Clinical Impression(s) / ED Diagnoses Final diagnoses:  Nausea  Dizziness    Rx / DC Orders ED Discharge Orders    None       Hayden Rasmussen, MD 01/25/20 [phone]

## 2020-01-24 NOTE — ED Notes (Signed)
Pt provided cup of water for PO challenge

## 2021-10-27 ENCOUNTER — Emergency Department (HOSPITAL_COMMUNITY): Payer: Medicare Other

## 2021-10-27 ENCOUNTER — Inpatient Hospital Stay (HOSPITAL_COMMUNITY)
Admission: EM | Admit: 2021-10-27 | Discharge: 2021-10-29 | DRG: 064 | Disposition: E | Payer: Medicare Other | Attending: Internal Medicine | Admitting: Internal Medicine

## 2021-10-27 ENCOUNTER — Other Ambulatory Visit: Payer: Self-pay

## 2021-10-27 DIAGNOSIS — N179 Acute kidney failure, unspecified: Secondary | ICD-10-CM | POA: Diagnosis present

## 2021-10-27 DIAGNOSIS — D72829 Elevated white blood cell count, unspecified: Secondary | ICD-10-CM | POA: Diagnosis present

## 2021-10-27 DIAGNOSIS — I5A Non-ischemic myocardial injury (non-traumatic): Secondary | ICD-10-CM | POA: Diagnosis present

## 2021-10-27 DIAGNOSIS — I4891 Unspecified atrial fibrillation: Secondary | ICD-10-CM | POA: Diagnosis not present

## 2021-10-27 DIAGNOSIS — I501 Left ventricular failure: Secondary | ICD-10-CM | POA: Diagnosis not present

## 2021-10-27 DIAGNOSIS — S062X9A Diffuse traumatic brain injury with loss of consciousness of unspecified duration, initial encounter: Secondary | ICD-10-CM | POA: Diagnosis not present

## 2021-10-27 DIAGNOSIS — K219 Gastro-esophageal reflux disease without esophagitis: Secondary | ICD-10-CM | POA: Diagnosis present

## 2021-10-27 DIAGNOSIS — R29714 NIHSS score 14: Secondary | ICD-10-CM | POA: Diagnosis present

## 2021-10-27 DIAGNOSIS — Z66 Do not resuscitate: Secondary | ICD-10-CM | POA: Diagnosis present

## 2021-10-27 DIAGNOSIS — I130 Hypertensive heart and chronic kidney disease with heart failure and stage 1 through stage 4 chronic kidney disease, or unspecified chronic kidney disease: Secondary | ICD-10-CM | POA: Diagnosis present

## 2021-10-27 DIAGNOSIS — I612 Nontraumatic intracerebral hemorrhage in hemisphere, unspecified: Secondary | ICD-10-CM | POA: Diagnosis present

## 2021-10-27 DIAGNOSIS — Z20822 Contact with and (suspected) exposure to covid-19: Secondary | ICD-10-CM | POA: Diagnosis present

## 2021-10-27 DIAGNOSIS — G936 Cerebral edema: Secondary | ICD-10-CM | POA: Diagnosis present

## 2021-10-27 DIAGNOSIS — G9349 Other encephalopathy: Secondary | ICD-10-CM | POA: Diagnosis present

## 2021-10-27 DIAGNOSIS — K2270 Barrett's esophagus without dysplasia: Secondary | ICD-10-CM | POA: Diagnosis present

## 2021-10-27 DIAGNOSIS — R578 Other shock: Secondary | ICD-10-CM | POA: Diagnosis not present

## 2021-10-27 DIAGNOSIS — D689 Coagulation defect, unspecified: Secondary | ICD-10-CM | POA: Diagnosis present

## 2021-10-27 DIAGNOSIS — S8001XA Contusion of right knee, initial encounter: Secondary | ICD-10-CM | POA: Diagnosis present

## 2021-10-27 DIAGNOSIS — Z7984 Long term (current) use of oral hypoglycemic drugs: Secondary | ICD-10-CM

## 2021-10-27 DIAGNOSIS — N189 Chronic kidney disease, unspecified: Secondary | ICD-10-CM

## 2021-10-27 DIAGNOSIS — Z7901 Long term (current) use of anticoagulants: Secondary | ICD-10-CM

## 2021-10-27 DIAGNOSIS — N1832 Chronic kidney disease, stage 3b: Secondary | ICD-10-CM | POA: Diagnosis present

## 2021-10-27 DIAGNOSIS — I2510 Atherosclerotic heart disease of native coronary artery without angina pectoris: Secondary | ICD-10-CM | POA: Diagnosis present

## 2021-10-27 DIAGNOSIS — G4733 Obstructive sleep apnea (adult) (pediatric): Secondary | ICD-10-CM | POA: Diagnosis present

## 2021-10-27 DIAGNOSIS — E1165 Type 2 diabetes mellitus with hyperglycemia: Secondary | ICD-10-CM | POA: Diagnosis present

## 2021-10-27 DIAGNOSIS — Z8249 Family history of ischemic heart disease and other diseases of the circulatory system: Secondary | ICD-10-CM

## 2021-10-27 DIAGNOSIS — I482 Chronic atrial fibrillation, unspecified: Secondary | ICD-10-CM | POA: Diagnosis present

## 2021-10-27 DIAGNOSIS — J9601 Acute respiratory failure with hypoxia: Secondary | ICD-10-CM | POA: Diagnosis not present

## 2021-10-27 DIAGNOSIS — Z888 Allergy status to other drugs, medicaments and biological substances status: Secondary | ICD-10-CM

## 2021-10-27 DIAGNOSIS — I619 Nontraumatic intracerebral hemorrhage, unspecified: Secondary | ICD-10-CM | POA: Insufficient documentation

## 2021-10-27 DIAGNOSIS — R0603 Acute respiratory distress: Secondary | ICD-10-CM

## 2021-10-27 DIAGNOSIS — E1122 Type 2 diabetes mellitus with diabetic chronic kidney disease: Secondary | ICD-10-CM | POA: Diagnosis present

## 2021-10-27 DIAGNOSIS — Z79899 Other long term (current) drug therapy: Secondary | ICD-10-CM

## 2021-10-27 DIAGNOSIS — I5032 Chronic diastolic (congestive) heart failure: Secondary | ICD-10-CM | POA: Diagnosis present

## 2021-10-27 DIAGNOSIS — Z6841 Body Mass Index (BMI) 40.0 and over, adult: Secondary | ICD-10-CM

## 2021-10-27 DIAGNOSIS — I629 Nontraumatic intracranial hemorrhage, unspecified: Secondary | ICD-10-CM

## 2021-10-27 DIAGNOSIS — S062XAA Diffuse traumatic brain injury with loss of consciousness status unknown, initial encounter: Secondary | ICD-10-CM | POA: Diagnosis present

## 2021-10-27 DIAGNOSIS — Z452 Encounter for adjustment and management of vascular access device: Secondary | ICD-10-CM

## 2021-10-27 DIAGNOSIS — X58XXXA Exposure to other specified factors, initial encounter: Secondary | ICD-10-CM | POA: Diagnosis present

## 2021-10-27 DIAGNOSIS — R4182 Altered mental status, unspecified: Secondary | ICD-10-CM | POA: Diagnosis present

## 2021-10-27 DIAGNOSIS — I10 Essential (primary) hypertension: Secondary | ICD-10-CM

## 2021-10-27 DIAGNOSIS — Z85820 Personal history of malignant melanoma of skin: Secondary | ICD-10-CM

## 2021-10-27 DIAGNOSIS — Z515 Encounter for palliative care: Secondary | ICD-10-CM

## 2021-10-27 DIAGNOSIS — G935 Compression of brain: Secondary | ICD-10-CM | POA: Diagnosis present

## 2021-10-27 DIAGNOSIS — S062X9D Diffuse traumatic brain injury with loss of consciousness of unspecified duration, subsequent encounter: Secondary | ICD-10-CM | POA: Diagnosis not present

## 2021-10-27 DIAGNOSIS — M109 Gout, unspecified: Secondary | ICD-10-CM | POA: Diagnosis present

## 2021-10-27 LAB — I-STAT ARTERIAL BLOOD GAS, ED
Acid-base deficit: 5 mmol/L — ABNORMAL HIGH (ref 0.0–2.0)
Bicarbonate: 19.4 mmol/L — ABNORMAL LOW (ref 20.0–28.0)
Calcium, Ion: 1.22 mmol/L (ref 1.15–1.40)
HCT: 40 % (ref 39.0–52.0)
Hemoglobin: 13.6 g/dL (ref 13.0–17.0)
O2 Saturation: 100 %
Patient temperature: 96.5
Potassium: 4.6 mmol/L (ref 3.5–5.1)
Sodium: 141 mmol/L (ref 135–145)
TCO2: 20 mmol/L — ABNORMAL LOW (ref 22–32)
pCO2 arterial: 30.3 mmHg — ABNORMAL LOW (ref 32–48)
pH, Arterial: 7.41 (ref 7.35–7.45)
pO2, Arterial: 217 mmHg — ABNORMAL HIGH (ref 83–108)

## 2021-10-27 LAB — CBC WITH DIFFERENTIAL/PLATELET
Abs Immature Granulocytes: 0 10*3/uL (ref 0.00–0.07)
Basophils Absolute: 0 10*3/uL (ref 0.0–0.1)
Basophils Relative: 0 %
Eosinophils Absolute: 0 10*3/uL (ref 0.0–0.5)
Eosinophils Relative: 0 %
HCT: 46.4 % (ref 39.0–52.0)
Hemoglobin: 14.3 g/dL (ref 13.0–17.0)
Lymphocytes Relative: 32 %
Lymphs Abs: 8.1 10*3/uL — ABNORMAL HIGH (ref 0.7–4.0)
MCH: 24.1 pg — ABNORMAL LOW (ref 26.0–34.0)
MCHC: 30.8 g/dL (ref 30.0–36.0)
MCV: 78.2 fL — ABNORMAL LOW (ref 80.0–100.0)
Monocytes Absolute: 1.3 10*3/uL — ABNORMAL HIGH (ref 0.1–1.0)
Monocytes Relative: 5 %
Neutro Abs: 15.9 10*3/uL — ABNORMAL HIGH (ref 1.7–7.7)
Neutrophils Relative %: 63 %
Platelets: 207 10*3/uL (ref 150–400)
RBC: 5.93 MIL/uL — ABNORMAL HIGH (ref 4.22–5.81)
RDW: 19 % — ABNORMAL HIGH (ref 11.5–15.5)
Smear Review: ADEQUATE
WBC: 25.2 10*3/uL — ABNORMAL HIGH (ref 4.0–10.5)
nRBC: 0 % (ref 0.0–0.2)

## 2021-10-27 LAB — COMPREHENSIVE METABOLIC PANEL
ALT: 33 U/L (ref 0–44)
AST: 64 U/L — ABNORMAL HIGH (ref 15–41)
Albumin: 4.2 g/dL (ref 3.5–5.0)
Alkaline Phosphatase: 97 U/L (ref 38–126)
Anion gap: 14 (ref 5–15)
BUN: 61 mg/dL — ABNORMAL HIGH (ref 8–23)
CO2: 21 mmol/L — ABNORMAL LOW (ref 22–32)
Calcium: 9.6 mg/dL (ref 8.9–10.3)
Chloride: 106 mmol/L (ref 98–111)
Creatinine, Ser: 2.21 mg/dL — ABNORMAL HIGH (ref 0.61–1.24)
GFR, Estimated: 32 mL/min — ABNORMAL LOW (ref >60)
Glucose, Bld: 152 mg/dL — ABNORMAL HIGH (ref 70–99)
Potassium: 4.8 mmol/L (ref 3.5–5.1)
Sodium: 141 mmol/L (ref 135–145)
Total Bilirubin: 0.8 mg/dL (ref 0.3–1.2)
Total Protein: 7.1 g/dL (ref 6.5–8.1)

## 2021-10-27 LAB — ETHANOL: Alcohol, Ethyl (B): <10 mg/dL (ref <10)

## 2021-10-27 LAB — COOXEMETRY PANEL
Carboxyhemoglobin: 0.7 % (ref 0.5–1.5)
Methemoglobin: 0.9 % (ref 0.0–1.5)
O2 Saturation: 37.3 %
Total hemoglobin: 14 g/dL (ref 12.0–16.0)

## 2021-10-27 LAB — POC OCCULT BLOOD, ED: Fecal Occult Bld: NEGATIVE

## 2021-10-27 LAB — CBG MONITORING, ED: Glucose-Capillary: 121 mg/dL — ABNORMAL HIGH (ref 70–99)

## 2021-10-27 LAB — LACTIC ACID, PLASMA: Lactic Acid, Venous: 2.3 mmol/L (ref 0.5–1.9)

## 2021-10-27 LAB — TROPONIN I (HIGH SENSITIVITY): Troponin I (High Sensitivity): 95 ng/L — ABNORMAL HIGH (ref <18)

## 2021-10-27 MED ORDER — SODIUM CHLORIDE 0.9 % IV SOLN
500.0000 mg | Freq: Once | INTRAVENOUS | Status: AC
  Administered 2021-10-27: 500 mg via INTRAVENOUS
  Filled 2021-10-27: qty 5

## 2021-10-27 MED ORDER — SODIUM CHLORIDE 0.9 % IV SOLN
1.0000 g | Freq: Once | INTRAVENOUS | Status: AC
  Administered 2021-10-27: 1 g via INTRAVENOUS
  Filled 2021-10-27: qty 10

## 2021-10-27 MED ORDER — DILTIAZEM HCL-DEXTROSE 125-5 MG/125ML-% IV SOLN (PREMIX)
5.0000 mg/h | INTRAVENOUS | Status: DC
  Administered 2021-10-27: 5 mg/h via INTRAVENOUS
  Filled 2021-10-27: qty 125

## 2021-10-27 MED ORDER — MAGNESIUM SULFATE 2 GM/50ML IV SOLN
2.0000 g | Freq: Once | INTRAVENOUS | Status: AC
  Administered 2021-10-28: 2 g via INTRAVENOUS
  Filled 2021-10-27: qty 50

## 2021-10-27 MED ORDER — DILTIAZEM LOAD VIA INFUSION
10.0000 mg | Freq: Once | INTRAVENOUS | Status: AC
  Administered 2021-10-27: 10 mg via INTRAVENOUS
  Filled 2021-10-27: qty 10

## 2021-10-27 MED ORDER — FUROSEMIDE 10 MG/ML IJ SOLN
40.0000 mg | Freq: Once | INTRAMUSCULAR | Status: AC
  Administered 2021-10-27: 40 mg via INTRAVENOUS
  Filled 2021-10-27: qty 4

## 2021-10-27 MED ORDER — ALBUTEROL SULFATE (2.5 MG/3ML) 0.083% IN NEBU
10.0000 mg/h | INHALATION_SOLUTION | Freq: Once | RESPIRATORY_TRACT | Status: DC
  Filled 2021-10-27: qty 12

## 2021-10-27 NOTE — ED Provider Notes (Signed)
[facility] EMERGENCY DEPARTMENT Provider Note   CSN: [id number] Arrival date & time: 10/25/2021  2115     History  Chief Complaint  Patient presents with   Altered Mental Status    Eddie Davis is a 66 y.o. male.  Pt's neighbor informed EMS they went to check on pt and found him lying on the floor in his garage.  Pt states he laid down himself.  EMS reports pt was lying on floor with no clothes on.  They report pt cold and heart rate 150.  Pt reports he has a history of afib.  He reports he has had congetive heart failur in the past   The history is provided by the patient and the EMS personnel. No language interpreter was used.      Home Medications Prior to Admission medications   Medication Sig Start Date End Date Taking? Authorizing Provider  allopurinol (ZYLOPRIM) 100 MG tablet Take 100 mg by mouth daily.    [provider]  atorvastatin (LIPITOR) 40 MG tablet Take 40 mg by mouth daily. 01/14/18   [provider]  carvedilol (COREG) 12.5 MG tablet Take 12.5 mg by mouth 2 (two) times daily with a meal.    [provider]  digoxin (LANOXIN) 0.25 MG tablet Take 0.25 mg by mouth daily.    [provider]  diltiazem (CARDIZEM) 120 MG tablet Take 120 mg by mouth 2 (two) times daily.     [provider]  Esomeprazole Magnesium (NEXIUM 24HR) 20 MG TBEC Take 20 mg by mouth daily.    [provider]  ferrous sulfate 325 (65 FE) MG tablet Take 1 tablet (325 mg total) by mouth daily. 02/05/18   Drenda Freeze, MD  metFORMIN (GLUCOPHAGE) 1000 MG tablet Take 1,000 mg by mouth 2 (two) times daily. 01/14/18   [provider]  ondansetron (ZOFRAN) 4 MG tablet Take 1 tablet (4 mg total) by mouth every 6 (six) hours. 01/24/20   Hayden Rasmussen, MD  Rivaroxaban (XARELTO) 20 MG TABS tablet Take 20 mg by mouth daily.     [provider]  silver sulfADIAZINE (SILVADENE) 1 % cream Apply 1 application  topically 3 (three) times daily. After thoroughly washing face Patient not taking: Reported on 02/05/2018 02/13/15   Billy Fischer, MD  spironolactone (ALDACTONE) 25 MG tablet Take 25 mg by mouth daily.    [provider]  torsemide (DEMADEX) 20 MG tablet Take 20 mg by mouth daily as needed (for fluid or edema).  01/03/18   [provider]  traZODone (DESYREL) 50 MG tablet Take 25-50 mg by mouth at bedtime as needed for sleep.  12/31/17   [provider]      Allergies    Belviq [lorcaserin], Furosemide, and Hctz [hydrochlorothiazide]    Review of Systems   Review of Systems  All other systems reviewed and are negative.  Physical Exam Updated Vital Signs BP 122/88    Pulse (!) 148    Temp (!) 96.5 F (35.8 C) (Oral)    Resp (!) 24    SpO2 98%  Physical Exam  ED Results / Procedures / Treatments   Labs (all labs ordered are listed, but only abnormal results are displayed) Labs Reviewed  CBC WITH DIFFERENTIAL/PLATELET - Abnormal; Notable for the following components:      Result Value   WBC 25.2 (*)    RBC 5.93 (*)    MCV 78.2 (*)  MCH 24.1 (*)    RDW 19.0 (*)    All other components within normal limits  CBG MONITORING, ED - Abnormal; Notable for the following components:   Glucose-Capillary 121 (*)    All other components within normal limits  CULTURE, BLOOD (ROUTINE X 2)  CULTURE, BLOOD (ROUTINE X 2)  ETHANOL  COMPREHENSIVE METABOLIC PANEL  LACTIC ACID, PLASMA  LACTIC ACID, PLASMA  RAPID URINE DRUG SCREEN, HOSP PERFORMED  URINALYSIS, ROUTINE W REFLEX MICROSCOPIC  COOXEMETRY PANEL  POC OCCULT BLOOD, ED  TROPONIN I (HIGH SENSITIVITY)    EKG None  Radiology No results found.  Procedures .Critical Care Performed by: Fransico Meadow, PA-C Authorized by: Fransico Meadow, PA-C   Critical care provider statement:    Critical care time (minutes):  75   Critical care start time:  10/07/2021 9:00 PM   Critical care end time:  10/07/2021 11:36  PM   Critical care time was exclusive of:  Separately billable procedures and treating other patients   Critical care was necessary to treat or prevent imminent or life-threatening deterioration of the following conditions:  Circulatory failure, cardiac failure, renal failure and respiratory failure   Critical care was time spent personally by me on the following activities:  Development of treatment plan with patient or surrogate, discussions with consultants, evaluation of patient's response to treatment, examination of patient, ordering and review of laboratory studies, ordering and review of radiographic studies, ordering and performing treatments and interventions, pulse oximetry, re-evaluation of patient's condition, review of old charts and interpretation of cardiac output measurements   Care discussed with: admitting provider   Comments:     I discussed with hospitalist who will admit     Medications Ordered in ED Medications  diltiazem (CARDIZEM) 1 mg/mL load via infusion 10 mg (has no administration in time range)    And  diltiazem (CARDIZEM) 125 mg in dextrose 5% 125 mL (1 mg/mL) infusion (has no administration in time range)  cefTRIAXone (ROCEPHIN) 1 g in sodium chloride 0.9 % 100 mL IVPB (has no administration in time range)  azithromycin (ZITHROMAX) 500 mg in sodium chloride 0.9 % 250 mL IVPB (has no administration in time range)  albuterol (PROVENTIL) (2.5 MG/3ML) 0.083% nebulizer solution (has no administration in time range)  furosemide (LASIX) injection 40 mg (has no administration in time range)    ED Course/ Medical Decision Making/ A&P                           Medical Decision Making Pt found lying on garage floor   Amount and/or Complexity of Data Reviewed Independent Historian: EMS Labs: ordered. Decision-making details documented in ED Course.    Details: labs ordered reviewed and interpreted  Wbc count is 25.2 troponin elevated to 95 Radiology: ordered and  independent interpretation performed. Decision-making details documented in ED Course.    Details: chest xray reviewed and shows no acute abnormaltiy ECG/medicine tests: ordered and independent interpretation performed. Decision-making details documented in ED Course.    Details: Afib RVR 140  Risk Prescription drug management.           Final Clinical Impression(s) / ED Diagnoses Final diagnoses:  Atrial fibrillation with RVR [facility])  Respiratory distress    Rx / DC Orders ED Discharge Orders     None         Sidney Ace 10/06/2021 2342    Carmin Muskrat, MD 10/15/2021 [phone]

## 2021-10-27 NOTE — ED Triage Notes (Addendum)
Pt arrived via [facility] for cc of altered mental status, generalized weakness, tachycardia. Pt was found down on garage floor naked. Pt reported to EMS that he "felt tired and laid down." Able to answer all questions appropriately per EMS. No access obtained. Albuterol administered in route, audible wheezing noted. Pt requiring stimulation to remain awake.   BP 140/80 HR 150 RR 24 SPO2 99% RA CBG 137

## 2021-10-27 NOTE — ED Notes (Signed)
Sister Magda Kiel [phone] would like an update

## 2021-10-27 NOTE — ED Notes (Addendum)
Pt cold to touch, warm blankets placed on pt. Pt can be awakened with voice or physical stimulation, but falls back asleep almost immediately. Breathing labored, noticeable use of accessory muscles. RT to be contacted.  Pt not able to be taken to CT at this time due concerns for stability.

## 2021-10-27 NOTE — H&P (Addendum)
[place]   PATIENT NAME: Eddie Davis    MR#:  [id number]  DATE OF BIRTH:  December 08, 1955  DATE OF ADMISSION:  10/05/2021  PRIMARY CARE PHYSICIAN: Pcp, No   Patient is coming from: Home  REQUESTING/REFERRING PHYSICIAN: Fransico Meadow, Davis  CHIEF COMPLAINT:   Chief Complaint  Patient presents with   Altered Mental Status    HISTORY OF PRESENT ILLNESS:  Eddie Davis is a 66 y.o. Caucasian male with medical history significant for chronic atrial fibrillation on Xarelto, diastolic CHF, hypertension and obstructive sleep apnea, who presented to the ER with acute onset of altered mental status with confusion.  The patient's neighbor informed EMS that they went to check on the patient and found him lying on the floor in his garage.  The patient was apparently working on a car they are.  His friend stated that he was trying to get in touch with him the night before and could not.  The patient himself stated that he laid down.  He was found to be lying on the floor without close on.  He was called and had a heart rate at 150 and atrial fibrillation.  He denies any fever or chills.  No cough or wheezing.  He was fairly somnolent on BiPAP during the interview but was arousable.  He was a fairly poor historian therefore.  He denies any chest pain or palpitations.  No dysuria, oliguria or hematuria or flank pain.  He was noted to have right knee contusion and abrasion.  No reported nausea or vomiting or abdominal pain.  ED Course: When he came to the ER, temperature was 96.5 with a BP of 155/102 and later 134/80, heart rate of 135 and respiratory to 27 and heart rate later on was 148.  Pulse continues 100% on 2 L of O2 by nasal cannula.  Labs revealed a BUN of 67 and creatinine of 2.37 compared to 24 and 1.3 on 01/24/2020.  AST was 64 with otherwise unremarkable LFTs.  High-sensitivity troponin I was elevated at 95.  CBC showed significant cytosis 25.2 with neutrophilia and.  Blood cultures were  drawn.  UA was negative ABG showed pH 7.41, PCO2 30.3 and PO2 217 with HCO3 19.4 and pulse currently was 100% on BiPAP. EKG as reviewed by me : EKG showed atrial fibrillation with rapid ventricular sponsor 156, right axis deviation and Q waves anteroseptally Imaging: Portable chest ray showed enlarged cardiomediastinal silhouette likely accentuated by supine positioning and portable technique with no acute airspace disease. Noncontrast head CT scan revealed the following: 4.8 cm intraparenchymal hematoma within the left basal ganglia most compatible with a hemorrhagic infarct with moderate surrounding cytotoxic edema and resultant mass effect resulting in 5 mm left-to-right midline shift.   8 mm focus of hyperdensity within the right superior frontal gyrus possibly representing a small cortical contusion given the moderate right frontotemporal scalp hematoma. No associated calvarial fracture. No significant associated mass effect. This could be confirmed with MRI examination.  The patient was placed on IV Cardizem with bolus and drip without significant improvement of his heart rate at 12.5 mg/h at which time I started him on IV amiodarone with bolus followed by drip.  He was given 2 g of IV magnesium sulfate and 40 mg of IV Lasix given significant leukocytosis IV Rocephin and Zithromax.  Dr. Theda Sers with neurology was notified about the patient and evaluated him.  I discussed the case with neurosurgery Davis, Glenford Peers who  will follow the patient in a.m. and indicated that no current surgical intervention is indicated.  I also discussed the case with cardiology fellow Dr. Kalman Shan.  The patient will be admitted to PCU bed for further evaluation and management. PAST MEDICAL HISTORY:   Past Medical History:  Diagnosis Date   A-fib ([place])    Barrett's esophagus    CHF (congestive heart failure) ([place])    Hypertension    Melanoma ([place])    Sleep apnea    Type II diabetes mellitus with stage 3  chronic kidney disease ([place]) 11/14/2021    PAST SURGICAL HISTORY:   Past Surgical History:  Procedure Laterality Date   APPENDECTOMY     BACK SURGERY     ear surgery      SOCIAL HISTORY:   Social History   Tobacco Use   Smoking status: Never   Smokeless tobacco: Never  Substance Use Topics   Alcohol use: No    FAMILY HISTORY:   Positive for MI and hypertension  DRUG ALLERGIES:   Allergies  Allergen Reactions   Belviq [Lorcaserin] Shortness Of Breath and Other (See Comments)    Fatigue and headaches, also   Furosemide Other (See Comments)    Causes gout   Hctz [Hydrochlorothiazide] Cough    Causes gout, also    REVIEW OF SYSTEMS:   ROS As per history of present illness. All pertinent systems were reviewed above. Constitutional, HEENT, cardiovascular, respiratory, GI, GU, musculoskeletal, neuro, psychiatric, endocrine, integumentary and hematologic systems were reviewed and are otherwise negative/unremarkable except for positive findings mentioned above in the HPI.   MEDICATIONS AT HOME:   Prior to Admission medications   Medication Sig Start Date End Date Taking? Authorizing Provider  allopurinol (ZYLOPRIM) 100 MG tablet Take 100 mg by mouth daily.    [provider]  atorvastatin (LIPITOR) 40 MG tablet Take 40 mg by mouth daily. 01/14/18   [provider]  carvedilol (COREG) 12.5 MG tablet Take 12.5 mg by mouth 2 (two) times daily with a meal.    [provider]  digoxin (LANOXIN) 0.25 MG tablet Take 0.25 mg by mouth daily.    [provider]  diltiazem (CARDIZEM) 120 MG tablet Take 120 mg by mouth 2 (two) times daily.     [provider]  Esomeprazole Magnesium (NEXIUM 24HR) 20 MG TBEC Take 20 mg by mouth daily.    [provider]  ferrous sulfate 325 (65 FE) MG tablet Take 1 tablet (325 mg total) by mouth daily. 02/05/18   Drenda Freeze, MD  metFORMIN (GLUCOPHAGE) 1000 MG tablet Take 1,000 mg by mouth 2  (two) times daily. 01/14/18   [provider]  ondansetron (ZOFRAN) 4 MG tablet Take 1 tablet (4 mg total) by mouth every 6 (six) hours. 01/24/20   Hayden Rasmussen, MD  Rivaroxaban (XARELTO) 20 MG TABS tablet Take 20 mg by mouth daily.     [provider]  silver sulfADIAZINE (SILVADENE) 1 % cream Apply 1 application topically 3 (three) times daily. After thoroughly washing face Patient not taking: Reported on 02/05/2018 02/13/15   Billy Fischer, MD  spironolactone (ALDACTONE) 25 MG tablet Take 25 mg by mouth daily.    [provider]  torsemide (DEMADEX) 20 MG tablet Take 20 mg by mouth daily as needed (for fluid or edema).  01/03/18   [provider]  traZODone (DESYREL) 50 MG tablet Take 25-50 mg by mouth at bedtime as needed for sleep.  12/31/17  [provider]      VITAL SIGNS:  Blood pressure (!) 99/55, pulse 61, temperature (!) 96.5 F (35.8 C), temperature source Oral, resp. rate (!) 28, SpO2 99 %.  PHYSICAL EXAMINATION:  Physical Exam  GENERAL: Acutely ill 66 y.o.-year-old Caucasian male patient lying in the bed with conversational dyspnea on BiPAP.  He was very somnolent but arousable. EYES: Pupils equal, round, reactive to light and accommodation. No scleral icterus. Extraocular muscles intact.  HEENT: Head atraumatic, normocephalic. Oropharynx and nasopharynx clear.  NECK:  Supple, no jugular venous distention. No thyroid enlargement, no tenderness.  LUNGS: Mildly diminished bibasal breath sounds.  No use of accessory muscles of respiration.  CARDIOVASCULAR: Irregularly irregular tachycardic rhythm S1, S2 normal. No murmurs, rubs, or gallops.  ABDOMEN: Soft, nondistended, nontender. Bowel sounds present. No organomegaly or mass.  EXTREMITIES: No pedal edema, cyanosis, or clubbing.  NEUROLOGIC: Cranial nerves II through XII are intact. Muscle strength 5/5 in all extremities. Sensation intact. Gait not checked.  PSYCHIATRIC: The patient is  alert and oriented x 3.  Normal affect and good eye contact. SKIN/musculoskeletal: Right knee contusion and abrasion LABORATORY PANEL:   CBC Recent Labs  Lab November 20, 2021 0222  WBC 22.6*  HGB 12.8*  HCT 42.3  PLT 163   ------------------------------------------------------------------------------------------------------------------  Chemistries  Recent Labs  Lab 10/03/2021 2138 10/04/2021 2335 11-20-21 0222  NA 141   < > 142  K 4.8   < > 4.7  CL 106  --  107  CO2 21*  --  21*  GLUCOSE 152*  --  171*  BUN 61*  --  67*  CREATININE 2.21*  --  2.37*  CALCIUM 9.6  --  9.1  AST 64*  --   --   ALT 33  --   --   ALKPHOS 97  --   --   BILITOT 0.8  --   --    < > = values in this interval not displayed.   ------------------------------------------------------------------------------------------------------------------  Cardiac Enzymes No results for input(s): TROPONINI in the last 168 hours. ------------------------------------------------------------------------------------------------------------------  RADIOLOGY:  CT Head Wo Contrast  Result Date: 20-Nov-2021 CLINICAL DATA:  Altered mental status EXAM: CT HEAD WITHOUT CONTRAST TECHNIQUE: Contiguous axial images were obtained from the base of the skull through the vertex without intravenous contrast. RADIATION DOSE REDUCTION: This exam was performed according to the departmental dose-optimization program which includes automated exposure control, adjustment of the mA and/or kV according to patient size and/or use of iterative reconstruction technique. COMPARISON:  02/05/2018 FINDINGS: Brain: An intraparenchymal hematoma is seen within the left basal ganglia extending into the anterior pole of the left temporal lobe measuring 2.8 x 3.1 x 4.8 cm on axial image # 19/8 and coronal image # 28/5 most in keeping with a hemorrhagic infarct. There is moderate surrounding cytotoxic edema. Associated mass effect effaces the left lateral ventricle and  anterior third ventricle and results in approximately 5 mm left-to-right midline shift. There is effacement of the overlying sylvian fissure. No intraventricular hemorrhage identified. There is a 8 mm focus of intra-axial hyperdensity involving the right superior frontal gyrus which may represent a focal area of laminar necrosis, a small cortical mass, or a small cortical contusion. This is new since prior examination. No evidence of right lateral ventricular entrapment. Remaining ventricular size is normal. Cerebellum is unremarkable. Vascular: No hyperdense vessel or unexpected calcification. Skull: Normal. Negative for fracture or focal lesion. Sinuses/Orbits: No acute finding. Other: Small left mastoid effusion. Middle ear cavities are  clear. A moderate right frontotemporal scalp hematoma is noted IMPRESSION: 4.8 cm intraparenchymal hematoma within the left basal ganglia most compatible with a hemorrhagic infarct with moderate surrounding cytotoxic edema and resultant mass effect resulting in 5 mm left-to-right midline shift. 8 mm focus of hyperdensity within the right superior frontal gyrus possibly representing a small cortical contusion given the moderate right frontotemporal scalp hematoma. No associated calvarial fracture. No significant associated mass effect. This could be confirmed with MRI examination. These results were called by telephone at the time of interpretation on 11/11/21 at 2:16 am to provider Dr. Rockne Coons, who verbally acknowledged these results. Electronically Signed   By: Fidela Salisbury M.D.   On: 11-11-21 02:20   DG Chest Port 1 View  Result Date: 10/03/2021 CLINICAL DATA:  Altered level of consciousness, weakness, tachycardia EXAM: PORTABLE CHEST 1 VIEW COMPARISON:  02/05/2018 FINDINGS: Single frontal view of the chest demonstrates enlarged cardiomediastinal silhouette, likely accentuated by portable technique and supine positioning. No airspace disease, effusion, or pneumothorax.  No acute bony abnormalities. IMPRESSION: 1. Enlarged cardiomediastinal silhouette likely accentuated by supine positioning and portable technique. 2. No acute airspace disease. Electronically Signed   By: Randa Ngo M.D.   On: 10/06/2021 22:29      IMPRESSION AND PLAN:  Assessment and Plan: * Traumatic intracranial hematoma- (present on admission) - The patient will be admitted to a stepdown unit bed. - We will follow neurochecks per ICH protocol. - Neurology consult was obtained by Dr. Theda Sers.  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 - I also notified neurosurgery as mentioned above. - We will contact trauma surgery as well. - Per neurology this is less likely an embolic bleeding infarction though it is in the differential diagnosis.   Atrial fibrillation with rapid ventricular response ([place])- (present on admission) - The patient will be continued on IV amiodarone drip. - We are withdrawing IV Cardizem drip given borderline BP. - We will optimize potassium and magnesium. - The patient received 2 g of IV magnesium sulfate. - We will continue Coreg, Lanoxin and p.o. Cardizem with improved BP - Cardiology consult and 2D echo be obtained. - I discussed the case with Dr. Kalman Shan who will assess the patient tonight.  Acute kidney injury superimposed on chronic kidney disease ([place])- (present on admission) - The patient will be hydrated with IV normal saline. - We will follow BMP. - We will avoid nephrotoxins.  Leukocytosis- (present on admission) - The patient has no current clear infectious etiology. - This could be certainly related to stress demargination especially given his ICH. - He was given a dose of IV Rocephin and Zithromax and I will hold off further antibiotics at this time. - She had 2 blood cultures drawn.   Essential hypertension - We will continue his antihypertensives and monitor BP per ICH  protocol.   GOUT- (present on admission) - We will continue allopurinol.  GERD- (present on admission) We will continue PPI therapy.  Obstructive sleep apnea- (present on admission) - He will be on BiPAP.  Acute respiratory failure with hypoxia (HCC) - This is requiring BiPAP. - Given the patient's unresponsiveness during his ER stay, decision was made to intubate him and place him on mechanical ventilation. - He will be intubated by the ER physician. - PCCM consult will be obtained.   The patient was given IV Kcentra upon his CT scan results.  He will be admitted to an ICU bed.  DVT prophylaxis: SCDs.  Anticoagulation was stopped and medical prophylaxis contraindicated due to [place]. Advanced Care Planning:  Code Status: full code.  Family Communication:  The plan of care was discussed in details with the patient when he was responsive.  Later on I discussed the case with his sister who is aware of his guarded prognosis.  I answered all questions. The patient and his sister agreed to proceed with the above mentioned plan. Further management will depend upon hospital course. Disposition Plan: Back to previous home environment Consults called: Neurology, neurosurgery, cardiology and PCCM All the records are reviewed and case discussed with ED provider.  Status is: Inpatient  At the time of the admission, it appears that the appropriate admission status for this patient is inpatient.  This is judged to be reasonable and necessary in order to provide the required intensity of service to ensure the patient's safety given the presenting symptoms, physical exam findings and initial radiographic and laboratory data in the context of comorbid conditions.  The patient requires inpatient status due to high intensity of service, high risk of further deterioration and high frequency of surveillance required.  I certify that at the time of admission, it is my clinical judgment that the patient will  require inpatient hospital care extending more than 2 midnights.                            Dispo: The patient is from: Home              Anticipated d/c is to: Home              Patient currently is not medically stable to d/c.              Difficult to place patient: No  I discussed the case with the ICU team who will assume the care.  Authorized and performed by: Eugenie Norrie, MD Total critical care time: Approximately  85     minutes (60 minutes on 2/27 followed by 25 minutes on 2/28). Due to a high probability of clinically significant, life-threatening deterioration, the patient required my highest level of preparedness to intervene emergently and I personally spent this critical care time directly and personally managing the patient.  This critical care time included obtaining a history, examining the patient, pulse oximetry, ordering and review of studies, arranging urgent treatment with development of management plan, evaluation of patient's response to treatment, frequent reassessment, and discussions with other providers. This critical care time was performed to assess and manage the high probability of imminent, life-threatening deterioration that could result in multiorgan failure.  It was exclusive of separately billable procedures and treating other patients and teaching time.   Christel Mormon M.D on 11/24/21 at 5:35 AM  [facility]   From 7 PM-7 AM, contact night-coverage [email or web address]  CC: Primary care physician; Pcp, No

## 2021-10-28 ENCOUNTER — Inpatient Hospital Stay (HOSPITAL_COMMUNITY): Payer: Medicare Other

## 2021-10-28 ENCOUNTER — Encounter (HOSPITAL_COMMUNITY): Payer: Self-pay | Admitting: Family Medicine

## 2021-10-28 DIAGNOSIS — J9601 Acute respiratory failure with hypoxia: Secondary | ICD-10-CM | POA: Diagnosis not present

## 2021-10-28 DIAGNOSIS — I619 Nontraumatic intracerebral hemorrhage, unspecified: Secondary | ICD-10-CM

## 2021-10-28 DIAGNOSIS — N179 Acute kidney failure, unspecified: Secondary | ICD-10-CM

## 2021-10-28 DIAGNOSIS — R0603 Acute respiratory distress: Secondary | ICD-10-CM | POA: Diagnosis not present

## 2021-10-28 DIAGNOSIS — S062XAA Diffuse traumatic brain injury with loss of consciousness status unknown, initial encounter: Secondary | ICD-10-CM | POA: Diagnosis present

## 2021-10-28 DIAGNOSIS — E1122 Type 2 diabetes mellitus with diabetic chronic kidney disease: Secondary | ICD-10-CM

## 2021-10-28 DIAGNOSIS — S062X9A Diffuse traumatic brain injury with loss of consciousness of unspecified duration, initial encounter: Secondary | ICD-10-CM | POA: Diagnosis not present

## 2021-10-28 DIAGNOSIS — I629 Nontraumatic intracranial hemorrhage, unspecified: Secondary | ICD-10-CM

## 2021-10-28 DIAGNOSIS — G4733 Obstructive sleep apnea (adult) (pediatric): Secondary | ICD-10-CM

## 2021-10-28 DIAGNOSIS — I4891 Unspecified atrial fibrillation: Secondary | ICD-10-CM

## 2021-10-28 DIAGNOSIS — N183 Chronic kidney disease, stage 3 unspecified: Secondary | ICD-10-CM

## 2021-10-28 DIAGNOSIS — D72829 Elevated white blood cell count, unspecified: Secondary | ICD-10-CM | POA: Diagnosis present

## 2021-10-28 DIAGNOSIS — S062X9D Diffuse traumatic brain injury with loss of consciousness of unspecified duration, subsequent encounter: Secondary | ICD-10-CM

## 2021-10-28 DIAGNOSIS — I501 Left ventricular failure: Secondary | ICD-10-CM

## 2021-10-28 HISTORY — DX: Type 2 diabetes mellitus with diabetic chronic kidney disease: E11.22

## 2021-10-28 HISTORY — DX: Chronic kidney disease, stage 3 unspecified: N18.30

## 2021-10-28 LAB — POCT I-STAT 7, (LYTES, BLD GAS, ICA,H+H)
Acid-base deficit: 4 mmol/L — ABNORMAL HIGH (ref 0.0–2.0)
Bicarbonate: 22.1 mmol/L (ref 20.0–28.0)
Calcium, Ion: 1.2 mmol/L (ref 1.15–1.40)
HCT: 35 % — ABNORMAL LOW (ref 39.0–52.0)
Hemoglobin: 11.9 g/dL — ABNORMAL LOW (ref 13.0–17.0)
O2 Saturation: 97 %
Patient temperature: 99.5
Potassium: 4.9 mmol/L (ref 3.5–5.1)
Sodium: 145 mmol/L (ref 135–145)
TCO2: 23 mmol/L (ref 22–32)
pCO2 arterial: 46.1 mmHg (ref 32–48)
pH, Arterial: 7.291 — ABNORMAL LOW (ref 7.35–7.45)
pO2, Arterial: 99 mmHg (ref 83–108)

## 2021-10-28 LAB — CBC
HCT: 42.3 % (ref 39.0–52.0)
Hemoglobin: 12.8 g/dL — ABNORMAL LOW (ref 13.0–17.0)
MCH: 24 pg — ABNORMAL LOW (ref 26.0–34.0)
MCHC: 30.3 g/dL (ref 30.0–36.0)
MCV: 79.2 fL — ABNORMAL LOW (ref 80.0–100.0)
Platelets: 163 10*3/uL (ref 150–400)
RBC: 5.34 MIL/uL (ref 4.22–5.81)
RDW: 18.6 % — ABNORMAL HIGH (ref 11.5–15.5)
WBC: 22.6 10*3/uL — ABNORMAL HIGH (ref 4.0–10.5)
nRBC: 0 % (ref 0.0–0.2)

## 2021-10-28 LAB — LACTIC ACID, PLASMA
Lactic Acid, Venous: 1.6 mmol/L (ref 0.5–1.9)
Lactic Acid, Venous: 1.4 mmol/L (ref 0.5–1.9)

## 2021-10-28 LAB — I-STAT ARTERIAL BLOOD GAS, ED
Acid-base deficit: 4 mmol/L — ABNORMAL HIGH (ref 0.0–2.0)
Bicarbonate: 22.4 mmol/L (ref 20.0–28.0)
Calcium, Ion: 1.23 mmol/L (ref 1.15–1.40)
HCT: 38 % — ABNORMAL LOW (ref 39.0–52.0)
Hemoglobin: 12.9 g/dL — ABNORMAL LOW (ref 13.0–17.0)
O2 Saturation: 99 %
Patient temperature: 99.1
Potassium: 4.7 mmol/L (ref 3.5–5.1)
Sodium: 143 mmol/L (ref 135–145)
TCO2: 24 mmol/L (ref 22–32)
pCO2 arterial: 45 mmHg (ref 32–48)
pH, Arterial: 7.307 — ABNORMAL LOW (ref 7.35–7.45)
pO2, Arterial: 179 mmHg — ABNORMAL HIGH (ref 83–108)

## 2021-10-28 LAB — BASIC METABOLIC PANEL
Anion gap: 14 (ref 5–15)
BUN: 67 mg/dL — ABNORMAL HIGH (ref 8–23)
CO2: 21 mmol/L — ABNORMAL LOW (ref 22–32)
Calcium: 9.1 mg/dL (ref 8.9–10.3)
Chloride: 107 mmol/L (ref 98–111)
Creatinine, Ser: 2.37 mg/dL — ABNORMAL HIGH (ref 0.61–1.24)
GFR, Estimated: 30 mL/min — ABNORMAL LOW (ref >60)
Glucose, Bld: 171 mg/dL — ABNORMAL HIGH (ref 70–99)
Potassium: 4.7 mmol/L (ref 3.5–5.1)
Sodium: 142 mmol/L (ref 135–145)

## 2021-10-28 LAB — RAPID URINE DRUG SCREEN, HOSP PERFORMED
Amphetamines: NOT DETECTED
Barbiturates: NOT DETECTED
Benzodiazepines: NOT DETECTED
Cocaine: NOT DETECTED
Opiates: NOT DETECTED
Tetrahydrocannabinol: NOT DETECTED

## 2021-10-28 LAB — URINALYSIS, ROUTINE W REFLEX MICROSCOPIC
Bilirubin Urine: NEGATIVE
Glucose, UA: NEGATIVE mg/dL
Hgb urine dipstick: NEGATIVE
Ketones, ur: NEGATIVE mg/dL
Leukocytes,Ua: NEGATIVE
Nitrite: NEGATIVE
Protein, ur: NEGATIVE mg/dL
Specific Gravity, Urine: 1.014 (ref 1.005–1.030)
pH: 5 (ref 5.0–8.0)

## 2021-10-28 LAB — MRSA NEXT GEN BY PCR, NASAL: MRSA by PCR Next Gen: NOT DETECTED

## 2021-10-28 LAB — PATHOLOGIST SMEAR REVIEW

## 2021-10-28 LAB — LIPID PANEL
Cholesterol: 140 mg/dL (ref 0–200)
HDL: 22 mg/dL — ABNORMAL LOW (ref >40)
LDL Cholesterol: 83 mg/dL (ref 0–99)
Total CHOL/HDL Ratio: 6.4 RATIO
Triglycerides: 173 mg/dL — ABNORMAL HIGH (ref <150)
VLDL: 35 mg/dL (ref 0–40)

## 2021-10-28 LAB — HIV ANTIBODY (ROUTINE TESTING W REFLEX): HIV Screen 4th Generation wRfx: NONREACTIVE

## 2021-10-28 LAB — RESP PANEL BY RT-PCR (FLU A&B, COVID) ARPGX2
Influenza A by PCR: NEGATIVE
Influenza B by PCR: NEGATIVE
SARS Coronavirus 2 by RT PCR: NEGATIVE

## 2021-10-28 LAB — PROTIME-INR
INR: 1.2 (ref 0.8–1.2)
Prothrombin Time: 15 seconds (ref 11.4–15.2)

## 2021-10-28 LAB — HEMOGLOBIN A1C
Hgb A1c MFr Bld: 6.4 % — ABNORMAL HIGH (ref 4.8–5.6)
Mean Plasma Glucose: 136.98 mg/dL

## 2021-10-28 LAB — SODIUM: Sodium: 146 mmol/L — ABNORMAL HIGH (ref 135–145)

## 2021-10-28 LAB — TROPONIN I (HIGH SENSITIVITY): Troponin I (High Sensitivity): 74 ng/L — ABNORMAL HIGH (ref <18)

## 2021-10-28 LAB — HEPARIN LEVEL (UNFRACTIONATED): Heparin Unfractionated: <0.1 IU/mL — ABNORMAL LOW (ref 0.30–0.70)

## 2021-10-28 LAB — BRAIN NATRIURETIC PEPTIDE: B Natriuretic Peptide: 85.3 pg/mL (ref 0.0–100.0)

## 2021-10-28 LAB — APTT: aPTT: 24 seconds (ref 24–36)

## 2021-10-28 MED ORDER — FENTANYL BOLUS VIA INFUSION
25.0000 ug | INTRAVENOUS | Status: DC | PRN
  Filled 2021-10-28: qty 100

## 2021-10-28 MED ORDER — AMIODARONE HCL IN DEXTROSE 360-4.14 MG/200ML-% IV SOLN: 30.0000 mg/h | INTRAVENOUS | Status: DC

## 2021-10-28 MED ORDER — DOCUSATE SODIUM 50 MG/5ML PO LIQD: 100.0000 mg | Freq: Two times a day (BID) | ORAL | Status: DC | PRN

## 2021-10-28 MED ORDER — MIDAZOLAM HCL 2 MG/2ML IJ SOLN
1.0000 mg | INTRAMUSCULAR | Status: DC | PRN
  Administered 2021-10-28 (×2): 1 mg via INTRAVENOUS
  Filled 2021-10-28 (×2): qty 2

## 2021-10-28 MED ORDER — PANTOPRAZOLE 2 MG/ML SUSPENSION: 40.0000 mg | Freq: Every day | ORAL | Status: DC

## 2021-10-28 MED ORDER — ATORVASTATIN CALCIUM 40 MG PO TABS: 40.0000 mg | ORAL_TABLET | Freq: Every day | ORAL | Status: DC

## 2021-10-28 MED ORDER — ALLOPURINOL 100 MG PO TABS
100.0000 mg | ORAL_TABLET | Freq: Every day | ORAL | Status: DC
  Filled 2021-10-28: qty 1

## 2021-10-28 MED ORDER — DEXTROSE 5 % IV SOLN: INTRAVENOUS | Status: DC

## 2021-10-28 MED ORDER — FENTANYL 2500MCG IN NS 250ML (10MCG/ML) PREMIX INFUSION
25.0000 ug/h | INTRAVENOUS | Status: DC
  Administered 2021-10-28: 175 ug/h via INTRAVENOUS

## 2021-10-28 MED ORDER — FENTANYL 2500MCG IN NS 250ML (10MCG/ML) PREMIX INFUSION
0.0000 ug/h | INTRAVENOUS | Status: DC
  Administered 2021-10-28: 25 ug/h via INTRAVENOUS
  Filled 2021-10-28: qty 250

## 2021-10-28 MED ORDER — ACETAMINOPHEN 325 MG PO TABS: 650.0000 mg | ORAL_TABLET | ORAL | Status: DC | PRN

## 2021-10-28 MED ORDER — CHLORHEXIDINE GLUCONATE 0.12% ORAL RINSE (MEDLINE KIT)
15.0000 mL | Freq: Two times a day (BID) | OROMUCOSAL | Status: DC
  Administered 2021-10-28: 15 mL via OROMUCOSAL

## 2021-10-28 MED ORDER — ONDANSETRON HCL 4 MG PO TABS: 4.0000 mg | ORAL_TABLET | Freq: Four times a day (QID) | ORAL | Status: DC

## 2021-10-28 MED ORDER — EPINEPHRINE HCL 5 MG/250ML IV SOLN IN NS
0.5000 ug/min | INTRAVENOUS | Status: DC
  Administered 2021-10-28: 0.5 ug/min via INTRAVENOUS
  Filled 2021-10-28: qty 250

## 2021-10-28 MED ORDER — ETOMIDATE 2 MG/ML IV SOLN
INTRAVENOUS | Status: DC | PRN
  Administered 2021-10-28: 20 mg via INTRAVENOUS

## 2021-10-28 MED ORDER — EMPTY CONTAINERS FLEXIBLE MISC
1800.0000 mg | Freq: Once | Status: AC
  Administered 2021-10-28: 1800 mg via INTRAVENOUS
  Filled 2021-10-28: qty 180

## 2021-10-28 MED ORDER — POLYETHYLENE GLYCOL 3350 17 G PO PACK: 17.0000 g | PACK | Freq: Every day | ORAL | Status: DC

## 2021-10-28 MED ORDER — RIVAROXABAN 10 MG PO TABS: 20.0000 mg | ORAL_TABLET | Freq: Every day | ORAL | Status: DC

## 2021-10-28 MED ORDER — ONDANSETRON HCL 4 MG/2ML IJ SOLN: 4.0000 mg | Freq: Four times a day (QID) | INTRAMUSCULAR | Status: DC | PRN

## 2021-10-28 MED ORDER — GLYCOPYRROLATE 0.2 MG/ML IJ SOLN
0.2000 mg | INTRAMUSCULAR | Status: DC | PRN
  Administered 2021-10-28: 0.2 mg via INTRAVENOUS
  Filled 2021-10-28 (×2): qty 1

## 2021-10-28 MED ORDER — FENTANYL 2500MCG IN NS 250ML (10MCG/ML) PREMIX INFUSION
0.0000 ug/h | INTRAVENOUS | Status: DC
  Administered 2021-10-28: 350 ug/h via INTRAVENOUS
  Filled 2021-10-28: qty 250

## 2021-10-28 MED ORDER — NOREPINEPHRINE 4 MG/250ML-% IV SOLN
0.0000 ug/min | INTRAVENOUS | Status: DC
  Administered 2021-10-28 (×2): 40 ug/min via INTRAVENOUS
  Administered 2021-10-28: 10 ug/min via INTRAVENOUS
  Administered 2021-10-28: 20 ug/min via INTRAVENOUS
  Filled 2021-10-28: qty 500

## 2021-10-28 MED ORDER — FERROUS SULFATE 300 (60 FE) MG/5ML PO SYRP: 300.0000 mg | ORAL_SOLUTION | Freq: Every day | ORAL | Status: DC

## 2021-10-28 MED ORDER — SODIUM CHLORIDE 3 % IV BOLUS
250.0000 mL | Freq: Once | INTRAVENOUS | Status: AC
  Administered 2021-10-28: 250 mL via INTRAVENOUS
  Filled 2021-10-28: qty 500

## 2021-10-28 MED ORDER — ALPRAZOLAM 0.25 MG PO TABS: 0.2500 mg | ORAL_TABLET | Freq: Two times a day (BID) | ORAL | Status: DC | PRN

## 2021-10-28 MED ORDER — NOREPINEPHRINE 4 MG/250ML-% IV SOLN
INTRAVENOUS | Status: AC
  Filled 2021-10-28: qty 250

## 2021-10-28 MED ORDER — ACETAMINOPHEN 325 MG PO TABS: 650.0000 mg | ORAL_TABLET | Freq: Four times a day (QID) | ORAL | Status: DC | PRN

## 2021-10-28 MED ORDER — ORAL CARE MOUTH RINSE
15.0000 mL | OROMUCOSAL | Status: DC
  Administered 2021-10-28 (×2): 15 mL via OROMUCOSAL

## 2021-10-28 MED ORDER — DILTIAZEM HCL 60 MG PO TABS: 120.0000 mg | ORAL_TABLET | Freq: Two times a day (BID) | ORAL | Status: DC

## 2021-10-28 MED ORDER — DIPHENHYDRAMINE HCL 50 MG/ML IJ SOLN: 25.0000 mg | INTRAMUSCULAR | Status: DC | PRN

## 2021-10-28 MED ORDER — SODIUM BICARBONATE 8.4 % IV SOLN: 100.0000 meq | Freq: Once | INTRAVENOUS | Status: DC

## 2021-10-28 MED ORDER — MAGNESIUM HYDROXIDE 400 MG/5ML PO SUSP: 30.0000 mL | Freq: Every day | ORAL | Status: DC | PRN

## 2021-10-28 MED ORDER — ESOMEPRAZOLE MAGNESIUM 20 MG PO TBEC: 20.0000 mg | DELAYED_RELEASE_TABLET | Freq: Every day | ORAL | Status: DC

## 2021-10-28 MED ORDER — GLYCOPYRROLATE 0.2 MG/ML IJ SOLN: 0.2000 mg | INTRAMUSCULAR | Status: DC | PRN

## 2021-10-28 MED ORDER — DOCUSATE SODIUM 50 MG/5ML PO LIQD: 100.0000 mg | Freq: Two times a day (BID) | ORAL | Status: DC

## 2021-10-28 MED ORDER — MIDAZOLAM HCL 2 MG/2ML IJ SOLN
2.0000 mg | INTRAMUSCULAR | Status: DC | PRN
  Administered 2021-10-28 (×2): 2 mg via INTRAVENOUS
  Filled 2021-10-28 (×2): qty 2

## 2021-10-28 MED ORDER — ROCURONIUM BROMIDE 50 MG/5ML IV SOLN
INTRAVENOUS | Status: DC | PRN
  Administered 2021-10-28: 100 mg via INTRAVENOUS

## 2021-10-28 MED ORDER — ETOMIDATE 2 MG/ML IV SOLN
INTRAVENOUS | Status: AC
  Filled 2021-10-28: qty 20

## 2021-10-28 MED ORDER — AMIODARONE IV BOLUS ONLY 150 MG/100ML
150.0000 mg | Freq: Once | INTRAVENOUS | Status: AC
  Administered 2021-10-28: 150 mg via INTRAVENOUS
  Filled 2021-10-28: qty 100

## 2021-10-28 MED ORDER — AMIODARONE HCL IN DEXTROSE 360-4.14 MG/200ML-% IV SOLN
60.0000 mg/h | INTRAVENOUS | Status: DC
  Administered 2021-10-28 (×3): 60 mg/h via INTRAVENOUS
  Filled 2021-10-28 (×3): qty 200

## 2021-10-28 MED ORDER — SODIUM CHLORIDE 0.9 % IV SOLN: INTRAVENOUS | Status: DC

## 2021-10-28 MED ORDER — ZIPRASIDONE MESYLATE 20 MG IM SOLR: 20.0000 mg | Freq: Once | INTRAMUSCULAR | Status: DC

## 2021-10-28 MED ORDER — ROCURONIUM BROMIDE 50 MG/5ML IV SOLN
1.0000 mg/kg | Freq: Once | INTRAVENOUS | Status: DC
Start: 2021-10-28 — End: 2021-10-28

## 2021-10-28 MED ORDER — ROCURONIUM BROMIDE 10 MG/ML (PF) SYRINGE
PREFILLED_SYRINGE | INTRAVENOUS | Status: AC
  Filled 2021-10-28: qty 10

## 2021-10-28 MED ORDER — VASOPRESSIN 20 UNITS/100 ML INFUSION FOR SHOCK
0.0000 [IU]/min | INTRAVENOUS | Status: DC
  Administered 2021-10-28 (×2): 0.03 [IU]/min via INTRAVENOUS
  Filled 2021-10-28: qty 100

## 2021-10-28 MED ORDER — SPIRONOLACTONE 25 MG PO TABS: 25.0000 mg | ORAL_TABLET | Freq: Every day | ORAL | Status: DC

## 2021-10-28 MED ORDER — ETOMIDATE 2 MG/ML IV SOLN: 20.0000 mg | Freq: Once | INTRAVENOUS | Status: DC

## 2021-10-28 MED ORDER — GLYCOPYRROLATE 1 MG PO TABS: 1.0000 mg | ORAL_TABLET | ORAL | Status: DC | PRN

## 2021-10-28 MED ORDER — CHLORHEXIDINE GLUCONATE CLOTH 2 % EX PADS
6.0000 | MEDICATED_PAD | Freq: Every day | CUTANEOUS | Status: DC
  Administered 2021-10-28: 6 via TOPICAL

## 2021-10-28 MED ORDER — CARVEDILOL 12.5 MG PO TABS: 12.5000 mg | ORAL_TABLET | Freq: Two times a day (BID) | ORAL | Status: DC

## 2021-10-28 MED ORDER — SODIUM CHLORIDE 3 % IV SOLN
INTRAVENOUS | Status: DC
  Filled 2021-10-28 (×3): qty 500

## 2021-10-28 MED ORDER — FERROUS SULFATE 325 (65 FE) MG PO TABS
325.0000 mg | ORAL_TABLET | Freq: Every day | ORAL | Status: DC
Start: 2021-10-28 — End: 2021-10-28

## 2021-10-28 MED ORDER — DIGOXIN 125 MCG PO TABS: 0.2500 mg | ORAL_TABLET | Freq: Every day | ORAL | Status: DC

## 2021-10-28 MED ORDER — FENTANYL BOLUS VIA INFUSION
100.0000 ug | INTRAVENOUS | Status: DC | PRN
  Filled 2021-10-28: qty 100

## 2021-10-28 MED ORDER — MIDAZOLAM HCL 2 MG/2ML IJ SOLN
1.0000 mg | INTRAMUSCULAR | Status: DC | PRN
  Filled 2021-10-28: qty 2

## 2021-10-28 MED ORDER — GLYCOPYRROLATE 0.2 MG/ML IJ SOLN: 0.2000 mg | Freq: Once | INTRAMUSCULAR | Status: DC

## 2021-10-28 MED ORDER — SODIUM CHLORIDE 0.9 % IV BOLUS
500.0000 mL | Freq: Once | INTRAVENOUS | Status: AC
  Administered 2021-10-28: 500 mL via INTRAVENOUS

## 2021-10-28 MED ORDER — POLYVINYL ALCOHOL 1.4 % OP SOLN: 1.0000 [drp] | Freq: Four times a day (QID) | OPHTHALMIC | Status: DC | PRN

## 2021-10-28 MED ORDER — FENTANYL CITRATE PF 50 MCG/ML IJ SOSY: 50.0000 ug | PREFILLED_SYRINGE | INTRAMUSCULAR | Status: DC | PRN

## 2021-10-28 MED ORDER — TRAZODONE HCL 50 MG PO TABS: 25.0000 mg | ORAL_TABLET | Freq: Every evening | ORAL | Status: DC | PRN

## 2021-10-28 MED ORDER — TORSEMIDE 20 MG PO TABS: 20.0000 mg | ORAL_TABLET | Freq: Every day | ORAL | Status: DC | PRN

## 2021-10-28 MED ORDER — ACETAMINOPHEN 650 MG RE SUPP: 650.0000 mg | Freq: Four times a day (QID) | RECTAL | Status: DC | PRN

## 2021-10-28 MED ORDER — AMIODARONE HCL IN DEXTROSE 360-4.14 MG/200ML-% IV SOLN
60.0000 mg/h | INTRAVENOUS | Status: DC
  Administered 2021-10-28: 60 mg/h via INTRAVENOUS
  Filled 2021-10-28: qty 200

## 2021-10-28 MED ORDER — SILVER SULFADIAZINE 1 % EX CREA
1.0000 "application " | TOPICAL_CREAM | Freq: Three times a day (TID) | CUTANEOUS | Status: DC
  Filled 2021-10-28: qty 85

## 2021-10-28 MED ORDER — POLYETHYLENE GLYCOL 3350 17 G PO PACK: 17.0000 g | PACK | Freq: Every day | ORAL | Status: DC | PRN

## 2021-10-28 MED ORDER — SPIRONOLACTONE 25 MG PO TABS
25.0000 mg | ORAL_TABLET | Freq: Every day | ORAL | Status: DC
  Filled 2021-10-28: qty 1

## 2021-10-29 NOTE — ED Provider Notes (Addendum)
Patient seen by previous ER team for altered mental status and admitted to hospitalist.  Called by radiology for intracranial hemorrhage. Physical Exam  BP 102/73    Pulse (!) 50    Temp (!) 96.5 F (35.8 C) (Oral)    Resp (!) 28    SpO2 99%   Physical Exam Constitutional:      General: He is in acute distress.  Eyes:     Pupils: Pupils are equal, round, and reactive to light.  Cardiovascular:     Rate and Rhythm: Tachycardia present. Rhythm irregular.  Pulmonary:     Effort: Tachypnea and accessory muscle usage present.  Abdominal:     Palpations: Abdomen is soft.  Musculoskeletal:        General: Normal range of motion.  Neurological:     Mental Status: He is confused.    Procedures  .Critical Care Performed by: Orpah Greek, MD Authorized by: Orpah Greek, MD   Critical care provider statement:    Critical care time (minutes):  45   Critical care was necessary to treat or prevent imminent or life-threatening deterioration of the following conditions:  CNS failure or compromise   Critical care was time spent personally by me on the following activities:  Development of treatment plan with patient or surrogate, discussions with consultants, evaluation of patient's response to treatment, examination of patient, ordering and review of laboratory studies, ordering and review of radiographic studies, ordering and performing treatments and interventions, pulse oximetry, re-evaluation of patient's condition and review of old charts Procedure Name: Intubation Date/Time: Nov 13, 2021 6:16 AM Performed by: Orpah Greek, MD Pre-anesthesia Checklist: Patient identified, Patient being monitored, Emergency Drugs available, Timeout performed and Suction available Oxygen Delivery Method: Non-rebreather mask Preoxygenation: Pre-oxygenation with 100% oxygen Induction Type: Rapid sequence Ventilation: Mask ventilation without difficulty Laryngoscope Size: Glidescope  and 4 Grade View: Grade I Tube size: 7.5 mm Number of attempts: 1 Placement Confirmation: ETT inserted through vocal cords under direct vision, CO2 detector and Breath sounds checked- equal and bilateral Secured at: 25 cm Tube secured with: ETT holder     ED Course / MDM    Medical Decision Making Amount and/or Complexity of Data Reviewed Labs: ordered. Radiology: ordered.  Risk Prescription drug management. Decision regarding hospitalization.   Patient CT has resulted and he does have intracerebral hemorrhage in the basal ganglia region with 5 mm of midline shift.  I did discuss medications with the family.  They indicate that he was found on the ground today, its not clear when he was last seen normal.  It is therefore not clear when his last dose of Xarelto was.  Based on the severity of the bleed, however, it is felt that patient will need reversal.  Discussed with pharmacy, Andexxa being dosed by pharmacy.  Discussed with Dr. Theda Sers, on-call for neurology.  Recommends 3% hypertonic saline bolus and drip.  We will see patient as a consult.   6:15 - addendum: alerted by cardiology that patient is difficult to arouse. Admitted hold in ER. Re-evaluated and patient obtunded, on bipap. Decision to intubate. Will repeat CT head to evaluate for worsening bleed/herniation. Consult to critical care to admit to ICU.    Orpah Greek, MD 13-Nov-2021 [id number]    Orpah Greek, MD 2021-11-13 [phone]

## 2021-10-29 NOTE — ED Notes (Signed)
CT called for CTA head.  Sts they cannot complete test w/o a form signed d/t creatinine.  Hospitalist messaged.

## 2021-10-29 NOTE — H&P (Signed)
NAME:  Eddie Davis, MRN:  [id number], DOB:  1956/04/05, LOS: 1 ADMISSION DATE:  10/09/2021, CONSULTATION DATE:  2021/11/08 REFERRING MD: Dr. Doristine Bosworth, CHIEF COMPLAINT:  Intracranial Hemorrhage   History of Present Illness:  This is a 66 year old gentleman who presented from home after being found down by a friend in his garage for an unknown period of time.  History is obtained from the sister at the bedside, as well as discussion with bedside providers and chart review.  He has a past medical history of atrial fibrillation and is on Xarelto.  Upon arrival to the ED he was found to be in atrial fibrillation with RVR initially placed on a Cardizem drip.  He was somnolent but arousable at first and was placed on BiPAP.  Initially admitted to PCU on the hospital medicine service significant worsening mental status resulting in obtundation when examined by cardiology for his A-fib.  The decision was made to intubate the patient.  His CT head showed that there was a right intraparenchymal hematoma with traumatic contusion.  On repeat CT scan at 6 AM this morning there was progression of this hematoma with worsening midline shift.  Patient had been given Andexanet reversal agent for his hematoma, as well as hypertonic saline.  PCCM is being asked to admit patient to the neuro ICU for ongoing care.  Pertinent  Medical History  He additionally has type 2 diabetes and CKD stage III. CHF, back surgery Significant Hospital Events: Including procedures, antibiotic start and stop dates in addition to other pertinent events     Interim History / Subjective:    Objective   Blood pressure (!) 90/55, pulse (!) 58, temperature 99.2 F (37.3 C), temperature source Oral, resp. rate (!) 25, height 6\' 3"  (1.905 m), SpO2 92 %.    Vent Mode: PRVC FiO2 (%):  [40 %-100 %] 80 % Set Rate:  [12 bmp-24 bmp] 24 bmp Vt Set:  [670 mL] 670 mL PEEP:  [5 cmH20] 5 cmH20 Plateau Pressure:  [20 cmH20] 20 cmH20  No intake or  output data in the 24 hours ending November 08, 2021 0747 There were no vitals filed for this visit.  Examination: General: Intubated, sedated, critically ill HENT: Endotracheal tube to vent Lungs: Diminished bilaterally, no wheezes or crackles Cardiovascular: Irregularly irregular, heart rate in the 130s Abdomen: Obese, distended, soft Extremities: No edema, some chronic venous stasis Neuro: Moving the left eye, left arm, left leg spontaneously, purposeful but does not follow commands on fentanyl gtt  Imaging and labs personally reviewed CT scan of the head with large left sided intraparenchyal hemorrhage with vasogenic edema with midline shift.   Chest xray with cardiomegaly and pulmonary vascular congestion BMP shows elevated Cr CBC with leukocytosis  Resolved Hospital Problem list     Assessment & Plan:   Acute hypoxemic respiratory failure Acute suspected traumatic midline shift - brain compression Mr. Kandler is being admitted to the neuro ICU for ongoing evaluation of his intraparenchymal hematoma.  I discussed this case with neurosurgery.  At this point no indication for neurosurgical intervention.  They requested a CT angio to evaluate for aneurysmal component to this intraparenchymal hemorrhage.  We will continue hypertonic saline and maintain full vent support.   Atrial fibrillation longstanding with RVR Acute pulmonary edema  Continue amiodarone for rate control. Initially given ceftriaxone azithromycin in the ED for CAP - in the setting of A. Fib I suspect this is more pulmonary edema. May benefit from diuresis. Will hold on abx for  now.   AKI on CKD stage 3b - renal protective measures    Best Practice (right click and "Reselect all SmartList Selections" daily)   Diet/type: NPO w/ meds via tube DVT prophylaxis: not indicated , active bleed GI prophylaxis: PPI Lines: N/A Foley:  Yes, and it is still needed Code Status:  DNR Last date of multidisciplinary goals of care  discussion [Sister mary updated at bedside in the ED. She agrees to DNR. Her other two sisters are out of town and on the [place]: Recent Labs  Lab 10/02/2021 2138 10/02/2021 2335 2021-10-30 0222 2021-10-30 0656  WBC 25.2*  --  22.6*  --   NEUTROABS 15.9*  --   --   --   HGB 14.3 13.6 12.8* 12.9*  HCT 46.4 40.0 42.3 38.0*  MCV 78.2*  --  79.2*  --   PLT 207  --  163  --     Basic Metabolic Panel: Recent Labs  Lab 10/03/2021 2138 10/06/2021 2335 2021-10-30 0222 10/30/21 0656  NA 141 141 142 143  K 4.8 4.6 4.7 4.7  CL 106  --  107  --   CO2 21*  --  21*  --   GLUCOSE 152*  --  171*  --   BUN 61*  --  67*  --   CREATININE 2.21*  --  2.37*  --   CALCIUM 9.6  --  9.1  --    GFR: CrCl cannot be calculated (Unknown ideal weight.). Recent Labs  Lab 10/09/2021 2138 10/26/2021 2140 10-30-2021 0044 Oct 30, 2021 0222  WBC 25.2*  --   --  22.6*  LATICACIDVEN  --  2.3* 1.6  --     Liver Function Tests: Recent Labs  Lab 10/08/2021 2138  AST 64*  ALT 33  ALKPHOS 97  BILITOT 0.8  PROT 7.1  ALBUMIN 4.2   No results for input(s): LIPASE, AMYLASE in the last 168 hours. No results for input(s): AMMONIA in the last 168 hours.  ABG    Component Value Date/Time   PHART 7.307 (L) 10/30/21 0656   PCO2ART 45.0 October 30, 2021 0656   PO2ART 179 (H) 2021/10/30 0656   HCO3 22.4 10/30/21 0656   TCO2 24 October 30, 2021 0656   ACIDBASEDEF 4.0 (H) 10/30/2021 0656   O2SAT 99 10/30/21 0656     Coagulation Profile: Recent Labs  Lab 30-Oct-2021 0222  INR 1.2    Cardiac Enzymes: No results for input(s): CKTOTAL, CKMB, CKMBINDEX, TROPONINI in the last 168 hours.  HbA1C: Hgb A1c MFr Bld  Date/Time Value Ref Range Status  11/05/2009 02:08 PM (H) 4.6 - 6.1 % Final   6.6 (NOTE) The ADA recommends the following therapeutic goal for glycemic control related to Hgb A1c measurement: Goal of therapy: <6.5 Hgb A1c  Reference: American Diabetes Association: Clinical Practice Recommendations 2010,  Diabetes Care, 2010, 33: (Suppl  1).  09/20/2008 02:30 AM (H) 4.6 - 6.1 % Final   7.1 (NOTE)   The ADA recommends the following therapeutic goal for glycemic   control related to Hgb A1C measurement:   Goal of Therapy:   < 7.0% Hgb A1C   Reference: American Diabetes Association: Clinical Practice   Recommendations 2008, Diabetes Care,  2008, 31:(Suppl 1).    CBG: Recent Labs  Lab 10/13/2021 2125  GLUCAP 121*    Review of Systems:   Unable to obtain  Past Medical History:  He,  has a past medical history of A-fib ([place]), Barrett's esophagus, CHF (  congestive heart failure) ([place]), Hypertension, Melanoma ([place]), Sleep apnea, and Type II diabetes mellitus with stage 3 chronic kidney disease ([place]) (11/22/21).   Surgical History:   Past Surgical History:  Procedure Laterality Date   APPENDECTOMY     BACK SURGERY     ear surgery       Social History:   reports that he has never smoked. He has never used smokeless tobacco. He reports that he does not drink alcohol and does not use drugs.   Family History:  His family history is not on file.   Allergies Allergies  Allergen Reactions   Belviq [Lorcaserin] Shortness Of Breath and Other (See Comments)    Fatigue and headaches, also   Furosemide Other (See Comments)    Causes gout   Hctz [Hydrochlorothiazide] Cough    Causes gout, also     Home Medications  Prior to Admission medications   Medication Sig Start Date End Date Taking? Authorizing Provider  allopurinol (ZYLOPRIM) 100 MG tablet Take 100 mg by mouth daily.    [provider]  atorvastatin (LIPITOR) 40 MG tablet Take 40 mg by mouth daily. 01/14/18   [provider]  carvedilol (COREG) 12.5 MG tablet Take 12.5 mg by mouth 2 (two) times daily with a meal.    [provider]  digoxin (LANOXIN) 0.25 MG tablet Take 0.25 mg by mouth daily.    [provider]  diltiazem (CARDIZEM) 120 MG tablet Take 120 mg by mouth 2 (two) times daily.      [provider]  Esomeprazole Magnesium (NEXIUM 24HR) 20 MG TBEC Take 20 mg by mouth daily.    [provider]  ferrous sulfate 325 (65 FE) MG tablet Take 1 tablet (325 mg total) by mouth daily. 02/05/18   Drenda Freeze, MD  metFORMIN (GLUCOPHAGE) 1000 MG tablet Take 1,000 mg by mouth 2 (two) times daily. 01/14/18   [provider]  ondansetron (ZOFRAN) 4 MG tablet Take 1 tablet (4 mg total) by mouth every 6 (six) hours. 01/24/20   Hayden Rasmussen, MD  Rivaroxaban (XARELTO) 20 MG TABS tablet Take 20 mg by mouth daily.     [provider]  silver sulfADIAZINE (SILVADENE) 1 % cream Apply 1 application topically 3 (three) times daily. After thoroughly washing face Patient not taking: Reported on 02/05/2018 02/13/15   Billy Fischer, MD  spironolactone (ALDACTONE) 25 MG tablet Take 25 mg by mouth daily.    [provider]  torsemide (DEMADEX) 20 MG tablet Take 20 mg by mouth daily as needed (for fluid or edema).  01/03/18   [provider]  traZODone (DESYREL) 50 MG tablet Take 25-50 mg by mouth at bedtime as needed for sleep.  12/31/17   [provider]     Critical care time: 60 min    The patient is critically ill due to intraparenchymal hemorrhage, respiratory failure.  Critical care was necessary to treat or prevent imminent or life-threatening deterioration.  Critical care was time spent personally by me on the following activities: development of treatment plan with patient and/or surrogate as well as nursing, discussions with consultants, evaluation of patient's response to treatment, examination of patient, obtaining history from patient or surrogate, ordering and performing treatments and interventions, ordering and review of laboratory studies, ordering and review of radiographic studies, pulse oximetry, re-evaluation of patient's condition and participation in multidisciplinary rounds.   Critical Care Time devoted to patient care  services described in this note is  60 minutes. This time reflects time of care of this [place] . This critical care time does not reflect separately billable procedures or procedure time, teaching time or supervisory time of PA/NP/Med student/Med Resident etc but could involve care discussion time.       Spero Geralds [facility] 2021/10/31 8:27 AM  Pager: see AMION  If no response to pager , please call critical care on call (see AMION) until 7pm After 7:00 pm call Elink

## 2021-10-29 NOTE — Progress Notes (Signed)
Chart reviewed.  Patient not seen.  This is a 66 year old gentleman who presented to ED last night after he was found down on the floor.  He was diagnosed with intracranial hemorrhage.  Seen by neurosurgery however they recommended no surgical intervention.  Unfortunately early in the morning, patient decompensated, he was attended by [facility] and eventually intubated.  Currently under PCCM care and will be admitted to ICU.  Hospital service will sign off.  We will be glad to be back as primary attending once patient is medically stable to be transferred to floor.  We appreciate PCCM help.

## 2021-10-29 NOTE — Consult Note (Cosign Needed)
Reason for Consult:intraparenchymal hemorrhage Referring Physician: EDP  Eddie Davis is an 66 y.o. male.   HPI:  66 year old male presented to the ED last night after being found down at home. Wife states he has a history of a fib and failed back surgery. He is on xarelto daily. Patient was sedated and intubated about 5:00am this morning. Per the report he was awake and following commands upon arrival. Wife is at bedside now.   Past Medical History:  Diagnosis Date   A-fib ([place])    Barrett's esophagus    CHF (congestive heart failure) ([facility])    Hypertension    Melanoma ([place])    Sleep apnea    Type II diabetes mellitus with stage 3 chronic kidney disease ([place]) Nov 03, 2021    Past Surgical History:  Procedure Laterality Date   APPENDECTOMY     BACK SURGERY     ear surgery      Allergies  Allergen Reactions   Belviq [Lorcaserin] Shortness Of Breath and Other (See Comments)    Fatigue and headaches, also   Furosemide Other (See Comments)    Causes gout   Hctz [Hydrochlorothiazide] Cough    Causes gout, also    Social History   Tobacco Use   Smoking status: Never   Smokeless tobacco: Never  Substance Use Topics   Alcohol use: No    No family history on file.   Review of Systems  Positive ROS: intubated and sedated  All other systems have been reviewed and were otherwise negative with the exception of those mentioned in the HPI and as above.  Objective: Vital signs in last 24 hours: Temp:  [96.5 F (35.8 C)-99.2 F (37.3 C)] 99.2 F (37.3 C) (02/28 0650) Pulse Rate:  [39-150] 136 (02/28 0745) Resp:  [16-31] 25 (02/28 0745) BP: (78-155)/(44-102) 84/53 (02/28 0745) SpO2:  [90 %-100 %] 90 % (02/28 0745) FiO2 (%):  [40 %-100 %] 90 % (02/28 0800)  General Appearance: intubated and sedated, no distress, appears stated age Head: Normocephalic, without obvious abnormality, atraumatic Eyes: PERRL, conjunctiva/corneas clear, EOM's intact, fundi benign, both eyes       Throat:ETT Lungs: respirations unlabored Heart: Regular rate and rhythm Extremities: Extremities normal, atraumatic, no cyanosis or edema Pulses: 2+ and symmetric all extremities Skin: Skin color, texture, turgor normal, no rashes or lesions  NEUROLOGIC:   Mental status: intuabted and sedated Motor Exam - mae, left>right side Reflexes: symmetric, no pathologic reflexes, No Hoffman's, No clonus Cranial Nerves: I: smell Not tested  II: visual acuity  OS: na    OD: na  II: visual fields Full to confrontation  II: pupils Equal, round, reactive to light  III,VII: ptosis   III,IV,VI: extraocular muscles    V: mastication   V: facial light touch sensation    V,VII: corneal reflex    VII: facial muscle function - upper    VII: facial muscle function - lower   VIII: hearing   IX: soft palate elevation    IX,X: gag reflex   XI: trapezius strength    XI: sternocleidomastoid strength   XI: neck flexion strength    XII: tongue strength      Data Review Lab Results  Component Value Date   WBC 22.6 (H) November 03, 2021   HGB 12.9 (L) 11/03/2021   HCT 38.0 (L) 03-Nov-2021   MCV 79.2 (L) 11/03/2021   PLT 163 November 03, 2021   Lab Results  Component Value Date   NA 143 2021-11-03  K 4.7 21-Nov-2021   CL 107 11-21-2021   CO2 21 (L) 11/21/2021   BUN 67 (H) 2021/11/21   CREATININE 2.37 (H) 11-21-21   GLUCOSE 171 (H) 21-Nov-2021   Lab Results  Component Value Date   INR 1.2 11/21/2021    Radiology: CT HEAD WO CONTRAST (5MM)  Result Date: November 21, 2021 CLINICAL DATA:  Mental status changes, head bleed EXAM: CT HEAD WITHOUT CONTRAST TECHNIQUE: Contiguous axial images were obtained from the base of the skull through the vertex without intravenous contrast. RADIATION DOSE REDUCTION: This exam was performed according to the departmental dose-optimization program which includes automated exposure control, adjustment of the mA and/or kV according to patient size and/or use of iterative  reconstruction technique. COMPARISON:  Study at 0626 hours compared to 0157 hours FINDINGS: Brain: Generalized atrophy. Again identified large area of intraparenchymal hemorrhage involving the LEFT basal ganglia 4.5 x 3.2 cm unchanged. Surrounding vasogenic edema. Extension of blood into LEFT temporal lobe. 6 mm of LEFT-to-RIGHT midline shift. Additional small high attenuation focus high RIGHT frontal lobe 9 x 8 mm image 26 consistent with blood. No additional mass, hemorrhage, or infarct. No extra-axial fluid collections. Vascular: Atherosclerotic calcification of internal carotid arteries at skull base Skull: Intact.  Fluid within posterior scalp. Sinuses/Orbits: Mucosal thickening scattered ethmoid air cells and sphenoid sinus Change. Other: N/A IMPRESSION: Large area of intraparenchymal hemorrhage involving the LEFT basal ganglia 4.5 x 3.2 cm with surrounding vasogenic edema and extension into LEFT temporal lobe, stable versus earlier study. 6 mm of LEFT-to-RIGHT midline shift, previously 5 mm. Additional small high attenuation focus high RIGHT frontal lobe 9 x 8 mm, may represent hemorrhagic contusion in the setting of trauma or a hemorrhagic metastasis. No significant interval Electronically Signed   By: Lavonia Dana M.D.   On: 21-Nov-2021 07:35   CT Head Wo Contrast  Result Date: November 21, 2021 CLINICAL DATA:  Altered mental status EXAM: CT HEAD WITHOUT CONTRAST TECHNIQUE: Contiguous axial images were obtained from the base of the skull through the vertex without intravenous contrast. RADIATION DOSE REDUCTION: This exam was performed according to the departmental dose-optimization program which includes automated exposure control, adjustment of the mA and/or kV according to patient size and/or use of iterative reconstruction technique. COMPARISON:  02/05/2018 FINDINGS: Brain: An intraparenchymal hematoma is seen within the left basal ganglia extending into the anterior pole of the left temporal lobe measuring  2.8 x 3.1 x 4.8 cm on axial image # 19/8 and coronal image # 28/5 most in keeping with a hemorrhagic infarct. There is moderate surrounding cytotoxic edema. Associated mass effect effaces the left lateral ventricle and anterior third ventricle and results in approximately 5 mm left-to-right midline shift. There is effacement of the overlying sylvian fissure. No intraventricular hemorrhage identified. There is a 8 mm focus of intra-axial hyperdensity involving the right superior frontal gyrus which may represent a focal area of laminar necrosis, a small cortical mass, or a small cortical contusion. This is new since prior examination. No evidence of right lateral ventricular entrapment. Remaining ventricular size is normal. Cerebellum is unremarkable. Vascular: No hyperdense vessel or unexpected calcification. Skull: Normal. Negative for fracture or focal lesion. Sinuses/Orbits: No acute finding. Other: Small left mastoid effusion. Middle ear cavities are clear. A moderate right frontotemporal scalp hematoma is noted IMPRESSION: 4.8 cm intraparenchymal hematoma within the left basal ganglia most compatible with a hemorrhagic infarct with moderate surrounding cytotoxic edema and resultant mass effect resulting in 5 mm left-to-right midline shift. 8 mm focus of hyperdensity  within the right superior frontal gyrus possibly representing a small cortical contusion given the moderate right frontotemporal scalp hematoma. No associated calvarial fracture. No significant associated mass effect. This could be confirmed with MRI examination. These results were called by telephone at the time of interpretation on Oct 30, 2021 at 2:16 am to provider Dr. Rockne Coons, who verbally acknowledged these results. Electronically Signed   By: Fidela Salisbury M.D.   On: Oct 30, 2021 02:20   DG Chest Portable 1 View  Result Date: Oct 30, 2021 CLINICAL DATA:  Intubation.  Ventilator dependence. EXAM: PORTABLE CHEST 1 VIEW COMPARISON:  10/05/2021  FINDINGS: 0559 hours. Endotracheal tube tip is 6.9 cm above the base of the carina. NG tube tip is positioned over the proximal stomach. The cardio pericardial silhouette is enlarged. There is pulmonary vascular congestion without overt pulmonary edema. Mild retrocardiac atelectasis or infiltrate noted. No substantial pleural effusion. Telemetry leads overlie the chest. IMPRESSION: 1. Endotracheal tube tip is 6.9 cm above the base of the carina. 2. NG tube tip positioned over the proximal stomach. 3. Pulmonary vascular congestion with retrocardiac atelectasis or infiltrate. Electronically Signed   By: Misty Stanley M.D.   On: Oct 30, 2021 07:19   DG Chest Port 1 View  Result Date: 10/12/2021 CLINICAL DATA:  Altered level of consciousness, weakness, tachycardia EXAM: PORTABLE CHEST 1 VIEW COMPARISON:  02/05/2018 FINDINGS: Single frontal view of the chest demonstrates enlarged cardiomediastinal silhouette, likely accentuated by portable technique and supine positioning. No airspace disease, effusion, or pneumothorax. No acute bony abnormalities. IMPRESSION: 1. Enlarged cardiomediastinal silhouette likely accentuated by supine positioning and portable technique. 2. No acute airspace disease. Electronically Signed   By: Randa Ngo M.D.   On: 10/18/2021 22:29     Assessment/Plan: 65 year old male presented to the ED last night after being found down. We were called to review the head CT which showed a large left basal ganglia bleed which could involved the sylvian fissure. He apparently decompensated this morning around 5am and was intubated. Would recommend CTA head and neck. There is no need for neurosurgical intervention at this time.    Ocie Cornfield [facility] 10/30/21 8:11 AM

## 2021-10-29 NOTE — Progress Notes (Signed)
Pt extubated to comfort care at this time.

## 2021-10-29 NOTE — ED Notes (Signed)
Respiratory at bedside to transport Pt.  Hospitalist has not answered regarding CT form.  4N RN made aware and ok w/ transporting Pt w/o CT.

## 2021-10-29 NOTE — ED Notes (Signed)
Attempted to transport pt to CT with RT, enroute RN notified that CT scanner no longer open. Awaiting CT opening for scan.

## 2021-10-29 NOTE — Progress Notes (Signed)
Patient transported to 5G51 without complication.

## 2021-10-29 NOTE — Progress Notes (Signed)
°  Transition of Care [facility]) Screening Note   Patient Details  Name: XAVI TOMASIK Date of Birth: 1956-03-21   Transition of Care [facility]) CM/SW Contact:    Benard Halsted, LCSW Phone Number: Nov 02, 2021, 1:54 PM    Transition of Care Department [facility]) has reviewed patient and no TOC needs have been identified at this time. We will continue to monitor patient advancement through interdisciplinary progression rounds. If new patient transition needs arise, please place a TOC consult.

## 2021-10-29 NOTE — Progress Notes (Signed)
RT transported pt to and from CT without event. 

## 2021-10-29 NOTE — Progress Notes (Addendum)
STROKE TEAM PROGRESS NOTE   INTERVAL HISTORY Patient is seen in his room with no family at the bedside.  Yesterday, he was found down in his garage with a hematoma in the right frontotemporal region.  Head CT revealed left basal ganglia IPH with 61mm midline shift.  3% HTS was started, and Xarelto was reversed with Andexxa. He was intubated due to respiratory distress and soon became hypotensive, requiring pressors and a central line.  CTA would be optimal for workup, but patient's renal function makes this problematic.  Due to patient's overall poor prognosis, he has been made a DNR by his primary team after discussion with his family.  Will plan for follow up head CT or MRI later today.  Vitals:   Nov 23, 2021 1220 2021/11/23 1225 Nov 23, 2021 1230 2021/11/23 1235  BP: (!) 83/49 (!) 86/59 (!) 82/51 (!) 81/54  Pulse: (!) 113 (!) 105 (!) 107 (!) 108  Resp: (!) 23 (!) 24 (!) 23 (!) 21  Temp:      TempSrc:      SpO2: 97% 98% 97% 97%  Weight:      Height:       CBC:  Recent Labs  Lab 10/04/2021 2138 10/07/2021 2335 23-Nov-2021 0222 11/23/21 0656 11/23/2021 0940  WBC 25.2*  --  22.6*  --   --   NEUTROABS 15.9*  --   --   --   --   HGB 14.3   < > 12.8* 12.9* 11.9*  HCT 46.4   < > 42.3 38.0* 35.0*  MCV 78.2*  --  79.2*  --   --   PLT 207  --  163  --   --    < > = values in this interval not displayed.   Basic Metabolic Panel:  Recent Labs  Lab 10/25/2021 2138 10/10/2021 2335 11/23/2021 0222 11/23/2021 0656 Nov 23, 2021 0940 11-23-21 1034  NA 141   < > 142 143 145 146*  K 4.8   < > 4.7 4.7 4.9  --   CL 106  --  107  --   --   --   CO2 21*  --  21*  --   --   --   GLUCOSE 152*  --  171*  --   --   --   BUN 61*  --  67*  --   --   --   CREATININE 2.21*  --  2.37*  --   --   --   CALCIUM 9.6  --  9.1  --   --   --    < > = values in this interval not displayed.   Lipid Panel:  Recent Labs  Lab 11-23-21 1034  CHOL 140  TRIG 173*  HDL 22*  CHOLHDL 6.4  VLDL 35  LDLCALC 83   HgbA1c:  Recent Labs   Lab 11-23-2021 1034  HGBA1C 6.4*   Urine Drug Screen:  Recent Labs  Lab 11-23-2021 0116  LABOPIA NONE DETECTED  COCAINSCRNUR NONE DETECTED  LABBENZ NONE DETECTED  AMPHETMU NONE DETECTED  THCU NONE DETECTED  LABBARB NONE DETECTED    Alcohol Level  Recent Labs  Lab 10/02/2021 2140  ETH <10    IMAGING past 24 hours DG Chest 1 View  Result Date: 11/23/2021 CLINICAL DATA:  Central line placement EXAM: CHEST  1 VIEW COMPARISON:  Portable exam 1035 hours compared to 0559 hours FINDINGS: Tip of endotracheal tube projects 5.7 cm above carina. Nasogastric tube extends into stomach. RIGHT jugular  central venous catheter with tip projecting over confluence of LEFT brachiocephalic vein and SVC. Rotated to the RIGHT. Enlargement of cardiac silhouette with prominent mediastinum and aortic atherosclerotic calcifications. LEFT lower lobe infiltrate. No pleural effusion or pneumothorax. IMPRESSION: LEFT lower lobe infiltrate. No pneumothorax following line placement. Electronically Signed   By: Lavonia Dana M.D.   On: 2021-11-13 10:55   CT HEAD WO CONTRAST (5MM)  Result Date: 11/13/2021 CLINICAL DATA:  Mental status changes, head bleed EXAM: CT HEAD WITHOUT CONTRAST TECHNIQUE: Contiguous axial images were obtained from the base of the skull through the vertex without intravenous contrast. RADIATION DOSE REDUCTION: This exam was performed according to the departmental dose-optimization program which includes automated exposure control, adjustment of the mA and/or kV according to patient size and/or use of iterative reconstruction technique. COMPARISON:  Study at 0626 hours compared to 0157 hours FINDINGS: Brain: Generalized atrophy. Again identified large area of intraparenchymal hemorrhage involving the LEFT basal ganglia 4.5 x 3.2 cm unchanged. Surrounding vasogenic edema. Extension of blood into LEFT temporal lobe. 6 mm of LEFT-to-RIGHT midline shift. Additional small high attenuation focus high RIGHT  frontal lobe 9 x 8 mm image 26 consistent with blood. No additional mass, hemorrhage, or infarct. No extra-axial fluid collections. Vascular: Atherosclerotic calcification of internal carotid arteries at skull base Skull: Intact.  Fluid within posterior scalp. Sinuses/Orbits: Mucosal thickening scattered ethmoid air cells and sphenoid sinus Change. Other: N/A IMPRESSION: Large area of intraparenchymal hemorrhage involving the LEFT basal ganglia 4.5 x 3.2 cm with surrounding vasogenic edema and extension into LEFT temporal lobe, stable versus earlier study. 6 mm of LEFT-to-RIGHT midline shift, previously 5 mm. Additional small high attenuation focus high RIGHT frontal lobe 9 x 8 mm, may represent hemorrhagic contusion in the setting of trauma or a hemorrhagic metastasis. No significant interval Electronically Signed   By: Lavonia Dana M.D.   On: 11-13-2021 07:35   CT Head Wo Contrast  Result Date: 2021/11/13 CLINICAL DATA:  Altered mental status EXAM: CT HEAD WITHOUT CONTRAST TECHNIQUE: Contiguous axial images were obtained from the base of the skull through the vertex without intravenous contrast. RADIATION DOSE REDUCTION: This exam was performed according to the departmental dose-optimization program which includes automated exposure control, adjustment of the mA and/or kV according to patient size and/or use of iterative reconstruction technique. COMPARISON:  02/05/2018 FINDINGS: Brain: An intraparenchymal hematoma is seen within the left basal ganglia extending into the anterior pole of the left temporal lobe measuring 2.8 x 3.1 x 4.8 cm on axial image # 19/8 and coronal image # 28/5 most in keeping with a hemorrhagic infarct. There is moderate surrounding cytotoxic edema. Associated mass effect effaces the left lateral ventricle and anterior third ventricle and results in approximately 5 mm left-to-right midline shift. There is effacement of the overlying sylvian fissure. No intraventricular hemorrhage  identified. There is a 8 mm focus of intra-axial hyperdensity involving the right superior frontal gyrus which may represent a focal area of laminar necrosis, a small cortical mass, or a small cortical contusion. This is new since prior examination. No evidence of right lateral ventricular entrapment. Remaining ventricular size is normal. Cerebellum is unremarkable. Vascular: No hyperdense vessel or unexpected calcification. Skull: Normal. Negative for fracture or focal lesion. Sinuses/Orbits: No acute finding. Other: Small left mastoid effusion. Middle ear cavities are clear. A moderate right frontotemporal scalp hematoma is noted IMPRESSION: 4.8 cm intraparenchymal hematoma within the left basal ganglia most compatible with a hemorrhagic infarct with moderate surrounding cytotoxic edema and  resultant mass effect resulting in 5 mm left-to-right midline shift. 8 mm focus of hyperdensity within the right superior frontal gyrus possibly representing a small cortical contusion given the moderate right frontotemporal scalp hematoma. No associated calvarial fracture. No significant associated mass effect. This could be confirmed with MRI examination. These results were called by telephone at the time of interpretation on 11-20-21 at 2:16 am to provider Dr. Rockne Coons, who verbally acknowledged these results. Electronically Signed   By: Fidela Salisbury M.D.   On: 11/20/2021 02:20   DG Chest Portable 1 View  Result Date: 11-20-21 CLINICAL DATA:  Intubation.  Ventilator dependence. EXAM: PORTABLE CHEST 1 VIEW COMPARISON:  10/10/2021 FINDINGS: 0559 hours. Endotracheal tube tip is 6.9 cm above the base of the carina. NG tube tip is positioned over the proximal stomach. The cardio pericardial silhouette is enlarged. There is pulmonary vascular congestion without overt pulmonary edema. Mild retrocardiac atelectasis or infiltrate noted. No substantial pleural effusion. Telemetry leads overlie the chest. IMPRESSION: 1.  Endotracheal tube tip is 6.9 cm above the base of the carina. 2. NG tube tip positioned over the proximal stomach. 3. Pulmonary vascular congestion with retrocardiac atelectasis or infiltrate. Electronically Signed   By: Misty Stanley M.D.   On: 11-20-21 07:19   DG Chest Port 1 View  Result Date: 10/26/2021 CLINICAL DATA:  Altered level of consciousness, weakness, tachycardia EXAM: PORTABLE CHEST 1 VIEW COMPARISON:  02/05/2018 FINDINGS: Single frontal view of the chest demonstrates enlarged cardiomediastinal silhouette, likely accentuated by portable technique and supine positioning. No airspace disease, effusion, or pneumothorax. No acute bony abnormalities. IMPRESSION: 1. Enlarged cardiomediastinal silhouette likely accentuated by supine positioning and portable technique. 2. No acute airspace disease. Electronically Signed   By: Randa Ngo M.D.   On: 10/16/2021 22:29    PHYSICAL EXAM General:  Intubated patient in no acute distress Respiratory:  Respirations synchronous with ventilator Neurological: Pupils 60mm and reactive, corneal reflex present, moves all extremities to noxious stimuli.  Does not follow commands  ASSESSMENT/PLAN Mr. DACIAN ORRICO is a 66 y.o. male with history of HTN, obesity, CAD, CHF, a-fib on Xarelto, gout and GERD presenting after being found down in his garage with a hematoma in the right frontotemporal region.  Head CT revealed left basal ganglia IPH with 10mm midline shift.  3% HTS was started, and Xarelto was reversed with Andexxa. He was intubated due to respiratory distress and soon became hypotensive, requiring pressors and a central line.  CTA would be optimal for workup, but patient's renal function makes this problematic.  Due to patient's overall poor prognosis, he has been made a DNR by his primary team after discussion with his family.  Will plan for follow up head CT or MRI later today. ICH:  left basal ganglia in setting of Xarelto use CT head 4.8 cm IPH  in left basal ganglia with moderate surrounding cytotoxic edema and mass effect resulting in 73mm midline shift, 29mm hyperdensity in right superior frontal gyrus possibly representing small cortical contusion CTA head & neck pending MRI  pending Carotid Doppler  pending 2D Echo pending LDL 83 HgbA1c 6.4 VTE prophylaxis - SCDs    Diet   Diet NPO time specified   Xarelto (rivaroxaban) daily prior to admission, now on No antithrombotic secondary to IPH Therapy recommendations:  pending  Disposition:  pending  Cerebral edema with midline shift and brain compression 76mm left to right midline shift Continue HTS at 75 cc/hr with goal Na 155 Monitor Na q6h  History of Hypertension, now hypotensive Home meds:  Coreg 12.5 mg BID, aldactone 25 mg daily Unstable Hold home BP meds SBP goal <140 Vasopressors per primary team Long-term BP goal normotensive  Hyperlipidemia Home meds:  atorvastatin 40 mg daily, resume when appropriate,  LDL 83, goal < 70 Continue statin at discharge  Diabetes type II Controlled Home meds:  metformin 1000 mg BID HgbA1c 6.4, goal < 7.0 CBGs Recent Labs    10/23/2021 2125  GLUCAP 121*    SSI  Respiratory failure Patient was intubated due to respiratory distress and inability to protect airway Ventilator management per primary team  Other Stroke Risk Factors Advanced Age >/= 9  Obesity, Body mass index is 45.25 kg/m., BMI >/= 30 associated with increased stroke risk, recommend weight loss, diet and exercise as appropriate  Congestive heart failure  Other Active Problems   Hospital day # Cyrus , MSN, AGACNP-BC [facility] See Amion for schedule and pager information 2021/11/09 2:10 PM  ATTENDING ATTESTATION:  66 year old gentleman with past medical history of A-fib on Rivaroxaban.  He was found down in his garage.  On CT he has right frontal parietal and right basal ganglial intracranial hemorrhage.  His  anticoagulation was reversed.  Started on hypertonic saline.  He is currently in the ICU.  Family later decided to withdraw care.  Initial plan was for further work-up with MRI however this is no longer needed.  Neurology will sign off  Dr. Reeves Forth evaluated pt independently, reviewed imaging, chart, labs. Discussed and formulated plan with the APP. Please see APP note above for details.    This patient is critically ill due to respiratory distress, ICH and at significant risk of neurological worsening, death form heart failure, respiratory failure, recurrent stroke, bleeding from [facility], seizure, sepsis. This patient's care requires constant monitoring of vital signs, hemodynamics, respiratory and cardiac monitoring, review of multiple databases, neurological assessment, discussion with family, other specialists and medical decision making of high complexity. I spent 35 minutes of neurocritical care time in the care of this patient.   Jazzman Loughmiller,MD     To contact Stroke Continuity provider, please refer to [email or web address] After hours, contact General Neurology

## 2021-10-29 NOTE — Progress Notes (Signed)
[facility]  Family confirms that they desire to withdraw care. Comfort/withdrawal of care order set written.   Roselie Awkward, MD [facility] Pager: [phone] Cell: [phone] After 7:00 pm call Elink  [phone]

## 2021-10-29 NOTE — Assessment & Plan Note (Signed)
-   He will be on BiPAP.

## 2021-10-29 NOTE — Progress Notes (Signed)
°   11/17/2021 0547  Clinical Encounter Type  Visited With Patient and family together  Visit Type ED;Critical Care  Referral From Nurse  Consult/Referral To Chaplain    Chaplain Jorene Guest responded to page. The patient's Eddie Davis sat tearfully in the hallway. Chaplain provided emotional and words of comfort. Mrs. Arman Bogus requested for this chaplain to read from the sacred text and prayer for the patient. This note was prepared by Jeanine Luz, M.Div..  For questions please contact by phone [phone].

## 2021-10-29 NOTE — Assessment & Plan Note (Signed)
-   The patient will be hydrated with IV normal saline. - We will follow BMP. - We will avoid nephrotoxins. 

## 2021-10-29 NOTE — ED Notes (Signed)
Previously patient has been able to follow directions, readjusting own bipap mask, following basic commands. At this time pt is noticeably more obtunded. With significant physical stimuli, patient only minimally responds. CC at bedside.

## 2021-10-29 NOTE — Death Summary Note (Signed)
DEATH SUMMARY   Patient Details  Name: Eddie Davis MRN: [id number] DOB: Nov 19, 1955  Admission/Discharge Information   Admit Date:  11/19/2021  Date of Death: Date of Death: Nov 20, 2021  Time of Death: Time of Death: 12/06/1753  Length of Stay: 1  Referring Physician: Pcp, No   Reason(s) for Hospitalization  Intracerebral hemorrhage  Diagnoses  Preliminary cause of death:  Basal ganglia hemorrhagic stroke Secondary Diagnoses (including complications and co-morbidities):  Principal Problem:   Non-traumatic intracranial hemorrhage ([place]) Active Problems:   GOUT   Essential hypertension   GERD   Obstructive sleep apnea   Atrial fibrillation with rapid ventricular response (HCC)   Acute kidney injury superimposed on chronic kidney disease (HCC)   Leukocytosis   Type II diabetes mellitus with stage 3 chronic kidney disease (HCC)   Acute respiratory failure with hypoxia (HCC)   Intracranial hemorrhage (HCC)   Intracerebral hemorrhage Acute encephalopathy due to stroke Neurogenic circulatory shock Acute respiratory failure with hypoxemia   Brief Hospital Course (including significant findings, care, treatment, and services provided and events leading to death)  RMANI KELLOGG is a 66 y.o. year old male who presented to [facility] hospital in the setting of being found down in his garage.  He was brought by EMS to the ER where he was found to have a large spontaneous intracerebral hemorrhage in the basal ganglia area.  He was on anticoagulants which were treated with adnexa.  His developed respiratory failure, was placed on NIMV.  He had atrial fibrillation and this was treated with diltiazem.  His mental status declined and he required intubation.  Neurosurgery saw him and recommended a CT angiogram, but the patient was never able to get this because his blood pressure dropped.  His shock was felt to be a consequence of progression of his intracerebral hemorrhage.  We discussed this with the  patient's family and his overall poor prognosis. They requested that we palliatively withdraw care as his overall prognosis was noted to be very poor.     Pertinent Labs and Studies  Significant Diagnostic Studies DG Chest 1 View  Result Date: 11-20-21 CLINICAL DATA:  Central line placement EXAM: CHEST  1 VIEW COMPARISON:  Portable exam 1035 hours compared to 0559 hours FINDINGS: Tip of endotracheal tube projects 5.7 cm above carina. Nasogastric tube extends into stomach. RIGHT jugular central venous catheter with tip projecting over confluence of LEFT brachiocephalic vein and SVC. Rotated to the RIGHT. Enlargement of cardiac silhouette with prominent mediastinum and aortic atherosclerotic calcifications. LEFT lower lobe infiltrate. No pleural effusion or pneumothorax. IMPRESSION: LEFT lower lobe infiltrate. No pneumothorax following line placement. Electronically Signed   By: Lavonia Dana M.D.   On: 2021-11-20 10:55   CT HEAD WO CONTRAST (5MM)  Result Date: 11/20/21 CLINICAL DATA:  Mental status changes, head bleed EXAM: CT HEAD WITHOUT CONTRAST TECHNIQUE: Contiguous axial images were obtained from the base of the skull through the vertex without intravenous contrast. RADIATION DOSE REDUCTION: This exam was performed according to the departmental dose-optimization program which includes automated exposure control, adjustment of the mA and/or kV according to patient size and/or use of iterative reconstruction technique. COMPARISON:  Study at 0626 hours compared to 0157 hours FINDINGS: Brain: Generalized atrophy. Again identified large area of intraparenchymal hemorrhage involving the LEFT basal ganglia 4.5 x 3.2 cm unchanged. Surrounding vasogenic edema. Extension of blood into LEFT temporal lobe. 6 mm of LEFT-to-RIGHT midline shift. Additional small high attenuation focus high RIGHT frontal lobe 9 x  8 mm image 26 consistent with blood. No additional mass, hemorrhage, or infarct. No extra-axial fluid  collections. Vascular: Atherosclerotic calcification of internal carotid arteries at skull base Skull: Intact.  Fluid within posterior scalp. Sinuses/Orbits: Mucosal thickening scattered ethmoid air cells and sphenoid sinus Change. Other: N/A IMPRESSION: Large area of intraparenchymal hemorrhage involving the LEFT basal ganglia 4.5 x 3.2 cm with surrounding vasogenic edema and extension into LEFT temporal lobe, stable versus earlier study. 6 mm of LEFT-to-RIGHT midline shift, previously 5 mm. Additional small high attenuation focus high RIGHT frontal lobe 9 x 8 mm, may represent hemorrhagic contusion in the setting of trauma or a hemorrhagic metastasis. No significant interval Electronically Signed   By: Lavonia Dana M.D.   On: Nov 08, 2021 07:35   CT Head Wo Contrast  Result Date: 11/08/21 CLINICAL DATA:  Altered mental status EXAM: CT HEAD WITHOUT CONTRAST TECHNIQUE: Contiguous axial images were obtained from the base of the skull through the vertex without intravenous contrast. RADIATION DOSE REDUCTION: This exam was performed according to the departmental dose-optimization program which includes automated exposure control, adjustment of the mA and/or kV according to patient size and/or use of iterative reconstruction technique. COMPARISON:  02/05/2018 FINDINGS: Brain: An intraparenchymal hematoma is seen within the left basal ganglia extending into the anterior pole of the left temporal lobe measuring 2.8 x 3.1 x 4.8 cm on axial image # 19/8 and coronal image # 28/5 most in keeping with a hemorrhagic infarct. There is moderate surrounding cytotoxic edema. Associated mass effect effaces the left lateral ventricle and anterior third ventricle and results in approximately 5 mm left-to-right midline shift. There is effacement of the overlying sylvian fissure. No intraventricular hemorrhage identified. There is a 8 mm focus of intra-axial hyperdensity involving the right superior frontal gyrus which may represent  a focal area of laminar necrosis, a small cortical mass, or a small cortical contusion. This is new since prior examination. No evidence of right lateral ventricular entrapment. Remaining ventricular size is normal. Cerebellum is unremarkable. Vascular: No hyperdense vessel or unexpected calcification. Skull: Normal. Negative for fracture or focal lesion. Sinuses/Orbits: No acute finding. Other: Small left mastoid effusion. Middle ear cavities are clear. A moderate right frontotemporal scalp hematoma is noted IMPRESSION: 4.8 cm intraparenchymal hematoma within the left basal ganglia most compatible with a hemorrhagic infarct with moderate surrounding cytotoxic edema and resultant mass effect resulting in 5 mm left-to-right midline shift. 8 mm focus of hyperdensity within the right superior frontal gyrus possibly representing a small cortical contusion given the moderate right frontotemporal scalp hematoma. No associated calvarial fracture. No significant associated mass effect. This could be confirmed with MRI examination. These results were called by telephone at the time of interpretation on November 08, 2021 at 2:16 am to provider Dr. Rockne Coons, who verbally acknowledged these results. Electronically Signed   By: Fidela Salisbury M.D.   On: 2021-11-08 02:20   DG Chest Portable 1 View  Result Date: 11-08-21 CLINICAL DATA:  Intubation.  Ventilator dependence. EXAM: PORTABLE CHEST 1 VIEW COMPARISON:  10/14/2021 FINDINGS: 0559 hours. Endotracheal tube tip is 6.9 cm above the base of the carina. NG tube tip is positioned over the proximal stomach. The cardio pericardial silhouette is enlarged. There is pulmonary vascular congestion without overt pulmonary edema. Mild retrocardiac atelectasis or infiltrate noted. No substantial pleural effusion. Telemetry leads overlie the chest. IMPRESSION: 1. Endotracheal tube tip is 6.9 cm above the base of the carina. 2. NG tube tip positioned over the proximal stomach. 3. Pulmonary  vascular  congestion with retrocardiac atelectasis or infiltrate. Electronically Signed   By: Misty Stanley M.D.   On: Nov 25, 2021 07:19   DG Chest Port 1 View  Result Date: 10/25/2021 CLINICAL DATA:  Altered level of consciousness, weakness, tachycardia EXAM: PORTABLE CHEST 1 VIEW COMPARISON:  02/05/2018 FINDINGS: Single frontal view of the chest demonstrates enlarged cardiomediastinal silhouette, likely accentuated by portable technique and supine positioning. No airspace disease, effusion, or pneumothorax. No acute bony abnormalities. IMPRESSION: 1. Enlarged cardiomediastinal silhouette likely accentuated by supine positioning and portable technique. 2. No acute airspace disease. Electronically Signed   By: Randa Ngo M.D.   On: 10/04/2021 22:29    Microbiology Recent Results (from the past 240 hour(s))  Blood culture (routine x 2)     Status: None (Preliminary result)   Collection Time: 10/19/2021  9:39 PM   Specimen: BLOOD  Result Value Ref Range Status   Specimen Description BLOOD LEFT ANTECUBITAL  Final   Special Requests   Final    BOTTLES DRAWN AEROBIC AND ANAEROBIC Blood Culture adequate volume   Culture   Final    NO GROWTH < 12 HOURS Performed at [facility], [address], [place], [place] [address]    Report Status PENDING  Incomplete  Blood culture (routine x 2)     Status: None (Preliminary result)   Collection Time: 10/26/2021 10:30 PM   Specimen: BLOOD  Result Value Ref Range Status   Specimen Description BLOOD RIGHT ARM  Final   Special Requests   Final    BOTTLES DRAWN AEROBIC AND ANAEROBIC Blood Culture results may not be optimal due to an inadequate volume of blood received in culture bottles   Culture   Final    NO GROWTH < 12 HOURS Performed at [facility], [place] [address] [place], [place] [address]    Report Status PENDING  Incomplete  Resp Panel by RT-PCR (Flu A&B, Covid) Nasopharyngeal Swab     Status: None   Collection Time: 2021/11/25  7:52 AM    Specimen: Nasopharyngeal Swab; Nasopharyngeal(NP) swabs in vial transport medium  Result Value Ref Range Status   SARS Coronavirus 2 by RT PCR NEGATIVE NEGATIVE Final    Comment: (NOTE) SARS-CoV-2 target nucleic acids are NOT DETECTED.  The SARS-CoV-2 RNA is generally detectable in upper respiratory specimens during the acute phase of infection. The lowest concentration of SARS-CoV-2 viral copies this assay can detect is 138 copies/mL. A negative result does not preclude SARS-Cov-2 infection and should not be used as the sole basis for treatment or other patient management decisions. A negative result may occur with  improper specimen collection/handling, submission of specimen other than nasopharyngeal swab, presence of viral mutation(s) within the areas targeted by this assay, and inadequate number of viral copies(<138 copies/mL). A negative result must be combined with clinical observations, patient history, and epidemiological information. The expected result is Negative.  Fact Sheet for Patients:  EntrepreneurPulse.com.au  Fact Sheet for Healthcare Providers:  IncredibleEmployment.be  This test is no t yet approved or cleared by the [place] FDA and  has been authorized for detection and/or diagnosis of SARS-CoV-2 by FDA under an Emergency Use Authorization (EUA). This EUA will remain  in effect (meaning this test can be used) for the duration of the COVID-19 declaration under Section 564(b)(1) of the Act, 21 U.S.C.section 360bbb-3(b)(1), unless the authorization is terminated  or revoked sooner.       Influenza A by PCR NEGATIVE NEGATIVE Final   Influenza  B by PCR NEGATIVE NEGATIVE Final    Comment: (NOTE) The Xpert Xpress SARS-CoV-2/FLU/RSV plus assay is intended as an aid in the diagnosis of influenza from Nasopharyngeal swab specimens and should not be used as a sole basis for treatment. Nasal washings and aspirates are  unacceptable for Xpert Xpress SARS-CoV-2/FLU/RSV testing.  Fact Sheet for Patients: EntrepreneurPulse.com.au  Fact Sheet for Healthcare Providers: IncredibleEmployment.be  This test is not yet approved or cleared by the [place] FDA and has been authorized for detection and/or diagnosis of SARS-CoV-2 by FDA under an Emergency Use Authorization (EUA). This EUA will remain in effect (meaning this test can be used) for the duration of the COVID-19 declaration under Section 564(b)(1) of the Act, 21 U.S.C. section 360bbb-3(b)(1), unless the authorization is terminated or revoked.  Performed at [facility], [place] [address], [place], [place] [address]   MRSA Next Gen by PCR, Nasal     Status: None   Collection Time: 11/25/2021 10:34 AM   Specimen: Nasal Mucosa; Nasal Swab  Result Value Ref Range Status   MRSA by PCR Next Gen NOT DETECTED NOT DETECTED Final    Comment: (NOTE) The GeneXpert MRSA Assay (FDA approved for NASAL specimens only), is one component of a comprehensive MRSA colonization surveillance program. It is not intended to diagnose MRSA infection nor to guide or monitor treatment for MRSA infections. Test performance is not FDA approved in patients less than 46 years old. Performed at [facility], [place] [address], [place], [place] [address]     Lab Basic Metabolic Panel: Recent Labs  Lab 10/25/2021 2138 10/24/2021 2335 11-25-21 0222 Nov 25, 2021 0656 11/25/21 0940 11-25-21 1034  NA 141 141 142 143 145 146*  K 4.8 4.6 4.7 4.7 4.9  --   CL 106  --  107  --   --   --   CO2 21*  --  21*  --   --   --   GLUCOSE 152*  --  171*  --   --   --   BUN 61*  --  67*  --   --   --   CREATININE 2.21*  --  2.37*  --   --   --   CALCIUM 9.6  --  9.1  --   --   --    Liver Function Tests: Recent Labs  Lab 10/20/2021 2138  AST 64*  ALT 33  ALKPHOS 97  BILITOT 0.8  PROT 7.1  ALBUMIN 4.2   No results for input(s): LIPASE,  AMYLASE in the last 168 hours. No results for input(s): AMMONIA in the last 168 hours. CBC: Recent Labs  Lab 10/23/2021 2138 10/06/2021 2335 Nov 25, 2021 0222 11/25/21 0656 Nov 25, 2021 0940  WBC 25.2*  --  22.6*  --   --   NEUTROABS 15.9*  --   --   --   --   HGB 14.3 13.6 12.8* 12.9* 11.9*  HCT 46.4 40.0 42.3 38.0* 35.0*  MCV 78.2*  --  79.2*  --   --   PLT 207  --  163  --   --    Cardiac Enzymes: No results for input(s): CKTOTAL, CKMB, CKMBINDEX, TROPONINI in the last 168 hours. Sepsis Labs: Recent Labs  Lab 10/23/2021 2138 10/13/2021 2140 11-25-21 0044 November 25, 2021 0222 11-25-21 1034  WBC 25.2*  --   --  22.6*  --   LATICACIDVEN  --  2.3* 1.6  --  1.4    Procedures/Operations  Endotracheal  tube   Roselie Awkward 11/26/21, 6:04 PM

## 2021-10-29 NOTE — Progress Notes (Signed)
[facility] PCCM  Arrived to 4[facility] ICU Found down in garage New atrial fibrillation Noted to have right intraparenchymal hematoma with mass effect Has been given andexa, hypertonic saline Required intubation in the [facility] ER Has been receiving IV amiodarone In the ICU he is now in shock, starting levophed Neurosurgery feels that a CT angiogram would be helpful to identify the cause of the hemorrhage  Add sedation protocol  Starting levophed and vasopressin Have ordered arterial line Will place central line  Updated his sister in regards to the severity of his illness: he has life threatening shock and intracerebral hemorrhage.  The best approach to working up the hemorrhage is checking a CT angiogram which puts him at risk for needing long term dialysis.  His sister consents for this.  She also gives consent for a central venous line.  Code status DNR  Prognosis is poor, will update family further on their arrival.  Additional cc time by me 40 minutes  Roselie Awkward, MD  PCCM Pager: [phone] Cell: [phone] After 7:00 pm call [place]  [phone]

## 2021-10-29 NOTE — Assessment & Plan Note (Signed)
-   This is requiring BiPAP. - Given the patient's unresponsiveness during his ER stay, decision was made to intubate him and place him on mechanical ventilation. - He will be intubated by the ER physician. - PCCM consult will be obtained.

## 2021-10-29 NOTE — Progress Notes (Signed)
Patient transported to CT scan and back to ED23 without incidence.

## 2021-10-29 NOTE — Consult Note (Signed)
Neurology Consult H&P  Eddie Davis MR# [id number] 2021-11-08   CC:   History is obtained from: sister and chart.  HPI: Eddie Davis is a 66 y.o. male PMHx as reviewed below was found down in his garage and found to have hematoma on right frontotemporal region with traumatic contusion.     LKW: unclear tNK given: No not a candidate IR Thrombectomy No, not indicated Modified Rankin Scale: 0-Completely asymptomatic and back to baseline post- stroke NIHSS: 14  LOC Responsiveness 2 LOC Questions 1 LOC Commands 1 Horizontal eye movement 1 Visual field 0 Facial palsy 0 Motor arm - Right arm 1 Motor arm - Left arm 1 Motor leg - Right leg 2 Motor leg - Left leg 2 Limb ataxia 0 Sensory test 0 Language 3 Speech 0 Extinction and inattention 0   ROS: Unable to assess due to encephalopathy.  Previous medical history:  hypertension, obesity, OSA, non-obstructive CAD (2008), HFrecEF, AFL s/p CTI ablation 2009, chronic rate-controlled AF (on Xarelto), gout, multiple back surgeries, and GERD.  Past Medical History:  Diagnosis Date   A-fib ([place])    Barrett's esophagus    CHF (congestive heart failure) (Eddie Davis)    Hypertension    Melanoma (Eddie Davis)    Sleep apnea     No family history on file.  Social History:  reports that he has never smoked. He has never used smokeless tobacco. He reports that he does not drink alcohol and does not use drugs.   Prior to Admission medications   Medication Sig Start Date End Date Taking? Authorizing Provider  allopurinol (ZYLOPRIM) 100 MG tablet Take 100 mg by mouth daily.    [provider]  atorvastatin (LIPITOR) 40 MG tablet Take 40 mg by mouth daily. 01/14/18   [provider]  carvedilol (COREG) 12.5 MG tablet Take 12.5 mg by mouth 2 (two) times daily with a meal.    [provider]  digoxin (LANOXIN) 0.25 MG tablet Take 0.25 mg by mouth daily.    [provider]  diltiazem (CARDIZEM) 120 MG tablet Take  120 mg by mouth 2 (two) times daily.     [provider]  Esomeprazole Magnesium (NEXIUM 24HR) 20 MG TBEC Take 20 mg by mouth daily.    [provider]  ferrous sulfate 325 (65 FE) MG tablet Take 1 tablet (325 mg total) by mouth daily. 02/05/18   Drenda Freeze, MD  metFORMIN (GLUCOPHAGE) 1000 MG tablet Take 1,000 mg by mouth 2 (two) times daily. 01/14/18   [provider]  ondansetron (ZOFRAN) 4 MG tablet Take 1 tablet (4 mg total) by mouth every 6 (six) hours. 01/24/20   Hayden Rasmussen, MD  Rivaroxaban (XARELTO) 20 MG TABS tablet Take 20 mg by mouth daily.     [provider]  silver sulfADIAZINE (SILVADENE) 1 % cream Apply 1 application topically 3 (three) times daily. After thoroughly washing face Patient not taking: Reported on 02/05/2018 02/13/15   Eddie Fischer, MD  spironolactone (ALDACTONE) 25 MG tablet Take 25 mg by mouth daily.    [provider]  torsemide (DEMADEX) 20 MG tablet Take 20 mg by mouth daily as needed (for fluid or edema).  01/03/18   [provider]  traZODone (DESYREL) 50 MG tablet Take 25-50 mg by mouth at bedtime as needed for sleep.  12/31/17   [provider]    Exam: Current vital signs: BP 102/73    Pulse (!) 130  Temp (!) 96.5 F (35.8 C) (Oral)    Resp (!) 28    SpO2 99%   Physical Exam  Constitutional: Appears well-developed and well-nourished.  Psych: Affect appropriate to situation Eyes: No scleral injection HENT: No OP obstruction. Head: Normocephalic.  Cardiovascular: Normal rate and regular rhythm.  Respiratory: Effort normal, symmetric excursions bilaterally, no audible wheezing. GI: Soft.  No distension. There is no tenderness.  Skin: WDI lower extremities indurated and b/l great toes thickened.  Neuro: Mental Status: Patient is on bipap and obtunded Speech non verbal follows commands No signs of neglect. Visual Fields are full. Pupils pinpoint equal, round, and reactive to  light. Doll's (-)  Facial sensation is symmetric to temperature Hearing is intact to voice. Tone is normal. Bulk is normal. 4/5 right upper extremity, 5/5 left upper extremity. Bilateral lower extremities 3/5. Sensation is symmetric to temperature in the arms and legs. Deep Tendon Reflexes: decreased symmetrically in the biceps and patellae. Toes are mute bilaterally. Gait - Deferred  I have reviewed labs in epic and the pertinent results are: Troponin 74  I have reviewed the images obtained: NCT head showed 4.8 cm intraparenchymal hematoma within the left basal ganglia most compatible with a hemorrhagic infarct with moderate surrounding cytotoxic edema and resultant mass effect resulting in 5 mm left-to-right midline shift. 8 mm focus of hyperdensity within the right superior frontal gyrus possibly representing a small cortical contusion given the moderate right frontotemporal scalp hematoma. 2.8 x 3.1 x 4.8 cm on axial image # 19/8 and coronal image  Assessment: Eddie Davis is a 66 y.o. male PMHx HTN, OSA, CAD, HFrecEF, chronic rate-controlled AF (rivaroxaban) with right frontoparietal hemorrhagic contusion and right basal ganglia hemorrhagic stroke s/p andexanet to reverse coagulopathy. He has been normotensive since admission and during exam was hypotensive.   Impression:  Hemorrhagic stroke left basal ganglia. Cerebral edema Hemorrhagic contusion right frontoparietal region. GCS 9 - E(2) V(1) M(6) ICH score 1. NIHSS 14 Atrial fibrillation.  Plan: Elevate head of bed keep head midline. Blood pressure: MAP >65 SBP <140: On diltiazem drip however became significantly hypotensive and drop held. 3% Na 75cc/hr infusion - Goal serum Na 145-155. Serum Na every 6 hours. Repeat CT head in 6 hours (or sooner if clinical worsening). Echocardiogram. MRI brain without contrast when able. CTA or MRA head and neck. Continue BiPAP. Precautions: Aspiration/seizure/fall.  This patient  is critically ill and at significant risk of neurological worsening, death and care requires constant monitoring of vital signs, hemodynamics,respiratory and cardiac monitoring, neurological assessment, discussion with family, other specialists and medical decision making of high complexity. I spent 65 minutes of neurocritical care time  in the care of  this patient. This was time spent independent of any time provided by nurse practitioner or PA.  Electronically signed by:  Lynnae Sandhoff, MD Page: [phone] 11/18/2021, 2:41 AM  If 7pm- 7am, please page neurology on call as listed in [place].

## 2021-10-29 NOTE — Assessment & Plan Note (Signed)
-   We will continue allopurinol 

## 2021-10-29 NOTE — Progress Notes (Signed)
Patient has active intracranial hemorrhage and possibility for ruptured aneurysm as source which could change management. Needs stat CT angio despite elevated creatinine.

## 2021-10-29 NOTE — Procedures (Signed)
Central Venous Catheter Insertion Procedure Note  Eddie Davis  [id number]  1956-06-25  Date:2021-11-02  Time:1:03 PM   Provider Performing:Melvenia Favela   Procedure: Insertion of Non-tunneled Central Venous [phone]) with US guidance ([id number])   Indication(s) Medication administration  Consent Risks of the procedure as well as the alternatives and risks of each were explained to the patient and/or caregiver.  Consent for the procedure was obtained and is signed in the bedside chart  Anesthesia Topical only with 1% lidocaine   Timeout Verified patient identification, verified procedure, site/side was marked, verified correct patient position, special equipment/implants available, medications/allergies/relevant history reviewed, required imaging and test results available.  Sterile Technique Maximal sterile technique including full sterile barrier drape, hand hygiene, sterile gown, sterile gloves, mask, hair covering, sterile ultrasound probe cover (if used).  Procedure Description Area of catheter insertion was cleaned with chlorhexidine and draped in sterile fashion.  With real-time ultrasound guidance a central venous catheter was placed into the left internal jugular vein. Nonpulsatile blood flow and easy flushing noted in all ports.  The catheter was sutured in place and sterile dressing applied.  Complications/Tolerance None; patient tolerated the procedure well. Chest X-ray is ordered to verify placement for internal jugular or subclavian cannulation.   Chest x-ray is not ordered for femoral cannulation.  EBL Minimal  Specimen(s) None   Mitzi Hansen, MD Internal Medicine Resident PGY-3 [facility] 11/02/21 1:04 PM

## 2021-10-29 NOTE — Assessment & Plan Note (Addendum)
-   The patient will be continued on IV amiodarone drip. - We are withdrawing IV Cardizem drip given borderline BP. - We will optimize potassium and magnesium. - The patient received 2 g of IV magnesium sulfate. - We will continue Coreg, Lanoxin and p.o. Cardizem with improved BP - Cardiology consult and 2D echo be obtained. - I discussed the case with Dr. Kalman Shan who will assess the patient tonight.

## 2021-10-29 NOTE — Assessment & Plan Note (Signed)
-   We will continue PPI therapy 

## 2021-10-29 NOTE — Progress Notes (Addendum)
Progress Note  Patient Name: Eddie Davis Date of Encounter: 11/26/2021  [facility] Cardiologist: None   Subjective   Obtunded  Inpatient Medications    Scheduled Meds:  allopurinol  100 mg Per Tube Daily   atorvastatin  40 mg Per Tube Daily   docusate  100 mg Per Tube BID   etomidate       etomidate  20 mg Intravenous Once   [START ON 10/29/2021] ferrous sulfate  300 mg Per Tube Q breakfast   pantoprazole sodium  40 mg Per Tube Daily   polyethylene glycol  17 g Per Tube Daily   rocuronium bromide       silver sulfADIAZINE  1 application Topical TID   Continuous Infusions:  sodium chloride Stopped (11-26-2021 0257)   amiodarone 60 mg/hr (26-Nov-2021 0954)   fentaNYL infusion INTRAVENOUS 175 mcg/hr (2021/11/26 0954)   norepinephrine (LEVOPHED) Adult infusion 20 mcg/min (Nov 26, 2021 0955)   sodium chloride (hypertonic) 75 mL/hr at 11-26-2021 0954   vasopressin 0.03 Units/min (26-Nov-2021 0946)   PRN Meds: acetaminophen, ALPRAZolam, docusate, etomidate, fentaNYL, midazolam, midazolam, ondansetron (ZOFRAN) IV, polyethylene glycol, rocuronium   Vital Signs    Vitals:   26-Nov-2021 0800 2021/11/26 0815 2021-11-26 0845 2021-11-26 0900  BP: 93/81 (!) 75/52 (!) 65/56 (!) 65/47  Pulse:  (!) 122 (!) 115 (!) 56  Resp:  (!) 24 (!) 23 20  Temp:      TempSrc:      SpO2:  95% 93% 96%  Weight:    (!) 164.2 kg  Height: 6\' 3"  (1.905 m)       Intake/Output Summary (Last 24 hours) at November 26, 2021 0955 Last data filed at 11-26-21 0954 Gross per 24 hour  Intake 941.82 ml  Output --  Net 941.82 ml   Last 3 Weights 11/26/21 01/24/2020 02/05/2018  Weight (lbs) 361 lb 15.9 oz 389 lb 387 lb  Weight (kg) 164.2 kg 176.449 kg 175.542 kg      Telemetry    A-fib heart rates in the 90s- Personally Reviewed  ECG    Atrial fibrillation rapid ventricular response in the emergency department- Personally Reviewed  Physical Exam   Currently obtunded, intubated sedate, obtaining central line access with  irregularly irregular heart rate  Labs    High Sensitivity Troponin:   Recent Labs  Lab 10/24/2021 2138 10/26/2021 2339  TROPONINIHS 95* 74*     Chemistry Recent Labs  Lab 10/07/2021 2138 10/17/2021 2335 November 26, 2021 0222 11/26/21 0656 11-26-21 0940  NA 141   < > 142 143 145  K 4.8   < > 4.7 4.7 4.9  CL 106  --  107  --   --   CO2 21*  --  21*  --   --   GLUCOSE 152*  --  171*  --   --   BUN 61*  --  67*  --   --   CREATININE 2.21*  --  2.37*  --   --   CALCIUM 9.6  --  9.1  --   --   PROT 7.1  --   --   --   --   ALBUMIN 4.2  --   --   --   --   AST 64*  --   --   --   --   ALT 33  --   --   --   --   ALKPHOS 97  --   --   --   --  BILITOT 0.8  --   --   --   --   GFRNONAA 32*  --  30*  --   --   ANIONGAP 14  --  14  --   --    < > = values in this interval not displayed.    Lipids No results for input(s): CHOL, TRIG, HDL, LABVLDL, LDLCALC, CHOLHDL in the last 168 hours.  Hematology Recent Labs  Lab 10/23/2021 2138 10/12/2021 2335 10-31-21 0222 10-31-21 0656 10/31/21 0940  WBC 25.2*  --  22.6*  --   --   RBC 5.93*  --  5.34  --   --   HGB 14.3   < > 12.8* 12.9* 11.9*  HCT 46.4   < > 42.3 38.0* 35.0*  MCV 78.2*  --  79.2*  --   --   MCH 24.1*  --  24.0*  --   --   MCHC 30.8  --  30.3  --   --   RDW 19.0*  --  18.6*  --   --   PLT 207  --  163  --   --    < > = values in this interval not displayed.   Thyroid No results for input(s): TSH, FREET4 in the last 168 hours.  BNPNo results for input(s): BNP, PROBNP in the last 168 hours.  DDimer No results for input(s): DDIMER in the last 168 hours.   Radiology    CT HEAD WO CONTRAST (5MM)  Result Date: 10/31/21 CLINICAL DATA:  Mental status changes, head bleed EXAM: CT HEAD WITHOUT CONTRAST TECHNIQUE: Contiguous axial images were obtained from the base of the skull through the vertex without intravenous contrast. RADIATION DOSE REDUCTION: This exam was performed according to the departmental dose-optimization program  which includes automated exposure control, adjustment of the mA and/or kV according to patient size and/or use of iterative reconstruction technique. COMPARISON:  Study at 0626 hours compared to 0157 hours FINDINGS: Brain: Generalized atrophy. Again identified large area of intraparenchymal hemorrhage involving the LEFT basal ganglia 4.5 x 3.2 cm unchanged. Surrounding vasogenic edema. Extension of blood into LEFT temporal lobe. 6 mm of LEFT-to-RIGHT midline shift. Additional small high attenuation focus high RIGHT frontal lobe 9 x 8 mm image 26 consistent with blood. No additional mass, hemorrhage, or infarct. No extra-axial fluid collections. Vascular: Atherosclerotic calcification of internal carotid arteries at skull base Skull: Intact.  Fluid within posterior scalp. Sinuses/Orbits: Mucosal thickening scattered ethmoid air cells and sphenoid sinus Change. Other: N/A IMPRESSION: Large area of intraparenchymal hemorrhage involving the LEFT basal ganglia 4.5 x 3.2 cm with surrounding vasogenic edema and extension into LEFT temporal lobe, stable versus earlier study. 6 mm of LEFT-to-RIGHT midline shift, previously 5 mm. Additional small high attenuation focus high RIGHT frontal lobe 9 x 8 mm, may represent hemorrhagic contusion in the setting of trauma or a hemorrhagic metastasis. No significant interval Electronically Signed   By: Lavonia Dana M.D.   On: 10-31-2021 07:35   CT Head Wo Contrast  Result Date: 31-Oct-2021 CLINICAL DATA:  Altered mental status EXAM: CT HEAD WITHOUT CONTRAST TECHNIQUE: Contiguous axial images were obtained from the base of the skull through the vertex without intravenous contrast. RADIATION DOSE REDUCTION: This exam was performed according to the departmental dose-optimization program which includes automated exposure control, adjustment of the mA and/or kV according to patient size and/or use of iterative reconstruction technique. COMPARISON:  02/05/2018 FINDINGS: Brain: An  intraparenchymal hematoma is seen within the left  basal ganglia extending into the anterior pole of the left temporal lobe measuring 2.8 x 3.1 x 4.8 cm on axial image # 19/8 and coronal image # 28/5 most in keeping with a hemorrhagic infarct. There is moderate surrounding cytotoxic edema. Associated mass effect effaces the left lateral ventricle and anterior third ventricle and results in approximately 5 mm left-to-right midline shift. There is effacement of the overlying sylvian fissure. No intraventricular hemorrhage identified. There is a 8 mm focus of intra-axial hyperdensity involving the right superior frontal gyrus which may represent a focal area of laminar necrosis, a small cortical mass, or a small cortical contusion. This is new since prior examination. No evidence of right lateral ventricular entrapment. Remaining ventricular size is normal. Cerebellum is unremarkable. Vascular: No hyperdense vessel or unexpected calcification. Skull: Normal. Negative for fracture or focal lesion. Sinuses/Orbits: No acute finding. Other: Small left mastoid effusion. Middle ear cavities are clear. A moderate right frontotemporal scalp hematoma is noted IMPRESSION: 4.8 cm intraparenchymal hematoma within the left basal ganglia most compatible with a hemorrhagic infarct with moderate surrounding cytotoxic edema and resultant mass effect resulting in 5 mm left-to-right midline shift. 8 mm focus of hyperdensity within the right superior frontal gyrus possibly representing a small cortical contusion given the moderate right frontotemporal scalp hematoma. No associated calvarial fracture. No significant associated mass effect. This could be confirmed with MRI examination. These results were called by telephone at the time of interpretation on 10/29/21 at 2:16 am to provider Dr. Rockne Coons, who verbally acknowledged these results. Electronically Signed   By: Fidela Salisbury M.D.   On: 10/29/2021 02:20   DG Chest Portable 1  View  Result Date: 10-29-2021 CLINICAL DATA:  Intubation.  Ventilator dependence. EXAM: PORTABLE CHEST 1 VIEW COMPARISON:  10/04/2021 FINDINGS: 0559 hours. Endotracheal tube tip is 6.9 cm above the base of the carina. NG tube tip is positioned over the proximal stomach. The cardio pericardial silhouette is enlarged. There is pulmonary vascular congestion without overt pulmonary edema. Mild retrocardiac atelectasis or infiltrate noted. No substantial pleural effusion. Telemetry leads overlie the chest. IMPRESSION: 1. Endotracheal tube tip is 6.9 cm above the base of the carina. 2. NG tube tip positioned over the proximal stomach. 3. Pulmonary vascular congestion with retrocardiac atelectasis or infiltrate. Electronically Signed   By: Misty Stanley M.D.   On: 2021/10/29 07:19   DG Chest Port 1 View  Result Date: 10/11/2021 CLINICAL DATA:  Altered level of consciousness, weakness, tachycardia EXAM: PORTABLE CHEST 1 VIEW COMPARISON:  02/05/2018 FINDINGS: Single frontal view of the chest demonstrates enlarged cardiomediastinal silhouette, likely accentuated by portable technique and supine positioning. No airspace disease, effusion, or pneumothorax. No acute bony abnormalities. IMPRESSION: 1. Enlarged cardiomediastinal silhouette likely accentuated by supine positioning and portable technique. 2. No acute airspace disease. Electronically Signed   By: Randa Ngo M.D.   On: 10/23/2021 22:29    Cardiac Studies   No recent ECHO  Patient Profile     66 y.o. male with intracranial hemorrhage midline shift requiring Xarelto reversal, acute encephalopathy acute renal failure elevated troponin secondary to myocardial injury in the setting of AKI and A-fib with RVR with history of heart failure with recovered EF euvolemic, history of atrial flutter status post ablation in 2009 as well as history of nonobstructive coronary artery disease in 2008 by angiography.  Also has multiple comorbidities including  hypertension morbid obesity obstructive sleep apnea.  We were consulted secondary to atrial fibrillation with rapid ventricular response by the emergency  department team.  Assessment & Plan    A-fib with RVR - Continue with amiodarone IV.  He is off of carvedilol as well as diltiazem secondary to hypotension/shock.  Overall reasonable rate control. -Xarelto reversal in ER.  Obviously contraindicated now in the setting of the bleed.  Shock - Currently on multiple pressors.  Blood pressure systolic currently 77.  Poor urine output.  Critical care medicine notes reviewed.    Acute encephalopathy - Secondary to intracranial hemorrhage with midline shift as well as circulatory shock.  Continue with aggressive supportive care.  Hopefully will be able to get another CT scan soon if he is stable.  Troponin elevation - Myocardial injury secondary to intracranial hemorrhage, shock.  Gravely ill.  Discussed with nursing team.  Currently undergoing central line.  CRITICAL CARE Performed by: Candee Furbish   Total critical care time: 35 minutes  Critical care time was exclusive of separately billable procedures and treating other patients.  Critical care was necessary to treat or prevent imminent or life-threatening deterioration.  Critical care was time spent personally by me on the following activities: development of treatment plan with patient and/or surrogate as well as nursing, discussions with consultants, evaluation of patient's response to treatment, examination of patient, obtaining history from patient or surrogate, ordering and performing treatments and interventions, ordering and review of laboratory studies, ordering and review of radiographic studies, pulse oximetry and re-evaluation of patient's condition.   For questions or updates, please contact Frederic Please consult [email or web address] for contact info under        Signed, Candee Furbish, MD  11/26/2021, 9:55 AM

## 2021-10-29 NOTE — ED Notes (Signed)
Neurologist at bedside. 

## 2021-10-29 NOTE — Consult Note (Signed)
Cardiology Consult    Patient ID: Eddie Davis MRN: [id number], DOB/AGE: 02-21-1956   Admit date: 10/03/2021 Date of Consult: 11/16/21 Requesting Provider: Christel Mormon, MD  PCP:  Kathyrn Lass   [facility] Providers Cardiologist:  Deboraha Sprang, MD (not seen > 10 yrs)  Patient Profile    Eddie Davis is a 66 y.o. male with a history of hypertension, obesity, OSA, non-obstructive CAD (2008), HFrecEF, AFL s/p CTI ablation 2009, chronic rate-controlled AF (on Xarelto), gout, multiple back surgeries, and GERD. He is being seen today (2021-11-16) for the evaluation of atrial fibrillation.  History of Present Illness    Eddie Davis was brought into the ED yesterday after being found down on his garage floor naked after he apparently "felt tired and laid down". On arrival he was in AF with RVR but normotensive. ECG shows AF at a rate of 156 with IVCD, borderline RAD, and nonspecific ST-T wave changes. Blood gas was normal but due to tachypnea was placed on BiPAP. Otherwise, his labs have been notable for AKI with BUN/Cr 61/2.2, mag 1.6, WBC 25, negative U-tox, mild hyperglycemia. He was given lasix, broad spectrum antibiotics, and initially started on diltiazem infusion with little improvement in his rate, so amiodarone was added.   He was found to have an IPH in the left basal ganglia with 71mm midline shift, but able to follow commands. He has been given Andexxa for Xarelto reversal and 3% HTS. No neurosurgical intervention is planned at this time.   On my evaluation, the patient was unresponsive and unable to provide any history - ED attending notified. His sister was at bedside and states to the best of her knowledge, he was at baseline prior to this event. She has not seen him in a week.   Past Medical History   Past Medical History:  Diagnosis Date   A-fib ([place])    Barrett's esophagus    CHF (congestive heart failure) (HCC)    Hypertension    Melanoma ([place])    Sleep apnea      Past Surgical History:  Procedure Laterality Date   APPENDECTOMY     BACK SURGERY     ear surgery       Allergies  Allergen Reactions   Belviq [Lorcaserin] Shortness Of Breath and Other (See Comments)    Fatigue and headaches, also   Furosemide Other (See Comments)    Causes gout   Hctz [Hydrochlorothiazide] Cough    Causes gout, also   Inpatient Medications     albuterol  10 mg/hr Nebulization Once   allopurinol  100 mg Oral Daily   atorvastatin  40 mg Oral Daily   carvedilol  12.5 mg Oral BID WC   digoxin  0.25 mg Oral Daily   diltiazem  120 mg Oral BID   Esomeprazole Magnesium  20 mg Oral Daily   ferrous sulfate  325 mg Oral Daily   ondansetron  4 mg Oral Q6H   silver sulfADIAZINE  1 application Topical TID   spironolactone  25 mg Oral Daily    Family History    No family history on file. has no family status information on file.    Social History    Social History   Socioeconomic History   Marital status: Divorced    Spouse name: Not on file   Number of children: Not on file   Years of education: Not on file   Highest education level: Not on file  Occupational  History   Not on file  Tobacco Use   Smoking status: Never   Smokeless tobacco: Never  Substance and Sexual Activity   Alcohol use: No   Drug use: No   Sexual activity: Not on file  Other Topics Concern   Not on file  Social History Narrative   Not on file   Social Determinants of Health   Financial Resource Strain: Not on file  Food Insecurity: Not on file  Transportation Needs: Not on file  Physical Activity: Not on file  Stress: Not on file  Social Connections: Not on file  Intimate Partner Violence: Not on file     Review of Systems    Unable to obtain due to patient condition  Physical Exam    Blood pressure 102/73, pulse (!) 130, temperature (!) 96.5 F (35.8 C), temperature source Oral, resp. rate (!) 28, SpO2 99 %.  Vent Mode: BIPAP;PCV FiO2 (%):  [40 %-60 %] 40  % Set Rate:  [12 bmp] 12 bmp PEEP:  [5 cmH20] 5 cmH20 No intake or output data in the 24 hours ending Nov 13, 2021 0255 Wt Readings from Last 3 Encounters:  01/24/20 (!) 176.4 kg  02/05/18 (!) 175.5 kg  06/28/14 (!) 181.4 kg    CONSTITUTIONAL: obtunded with bipap in place, morbidly obese and chronically ill appearing. HEENT: normal NECK: no JVD, no masses CARDIAC: Irregular rhythm. Faint systolic murmur. No S3/S4. No friction rub. JVP difficult to quantify but probably wnl.  VASCULAR: Radial pulses intact bilaterally. No carotid bruits. PULMONARY/CHEST WALL: no deformities, on BiPAP. Limited auscultation anterolaterally reveals no wheezing, ronchi, or crackles.  ABDOMINAL: soft and protuberant but non-distended EXTREMITIES: trace edema, right leg more warm than left diffusely, mild chronic venous stasis dermatitis and healing wounds, gouty arthritis involving toes SKIN: Dry and intact without apparent rashes or wounds. No peripheral cyanosis. NEUROLOGIC: obtunded with GCS 7 (2/1/4), pupils 72mm and equal, no abnormal movements.   Labs    Recent Labs    10/24/2021 2138 10/26/2021 2339  TROPONINIHS 95* 74*   Lab Results  Component Value Date   WBC 25.2 (H) 10/08/2021   HGB 13.6 10/10/2021   HCT 40.0 10/20/2021   MCV 78.2 (L) 10/26/2021   PLT 207 10/01/2021    Recent Labs  Lab 10/01/2021 2138 10/10/2021 2335  NA 141 141  K 4.8 4.6  CL 106  --   CO2 21*  --   BUN 61*  --   CREATININE 2.21*  --   CALCIUM 9.6  --   PROT 7.1  --   BILITOT 0.8  --   ALKPHOS 97  --   ALT 33  --   AST 64*  --   GLUCOSE 152*  --    Lab Results  Component Value Date   CHOL  11/06/2009    156        ATP III CLASSIFICATION:  <200     mg/dL   Desirable  200-239  mg/dL   Borderline High  >=240    mg/dL   High          HDL 22 (L) 11/06/2009   LDLCALC (H) 11/06/2009    115        Total Cholesterol/HDL:CHD Risk Coronary Heart Disease Risk Table                     Men   Women  1/2 Average Risk    3.4   3.3  Average Risk  5.0   4.4  2 X Average Risk   9.6   7.1  3 X Average Risk  23.4   11.0        Use the calculated Patient Ratio above and the CHD Risk Table to determine the patient's CHD Risk.        ATP III CLASSIFICATION (LDL):  <100     mg/dL   Optimal  100-129  mg/dL   Near or Above                    Optimal  130-159  mg/dL   Borderline  160-189  mg/dL   High  >190     mg/dL   Very High   TRIG 95 11/06/2009      Radiology Studies    CT Head Wo Contrast  Result Date: 11/11/2021 CLINICAL DATA:  Altered mental status EXAM: CT HEAD WITHOUT CONTRAST TECHNIQUE: Contiguous axial images were obtained from the base of the skull through the vertex without intravenous contrast. RADIATION DOSE REDUCTION: This exam was performed according to the departmental dose-optimization program which includes automated exposure control, adjustment of the mA and/or kV according to patient size and/or use of iterative reconstruction technique. COMPARISON:  02/05/2018 FINDINGS: Brain: An intraparenchymal hematoma is seen within the left basal ganglia extending into the anterior pole of the left temporal lobe measuring 2.8 x 3.1 x 4.8 cm on axial image # 19/8 and coronal image # 28/5 most in keeping with a hemorrhagic infarct. There is moderate surrounding cytotoxic edema. Associated mass effect effaces the left lateral ventricle and anterior third ventricle and results in approximately 5 mm left-to-right midline shift. There is effacement of the overlying sylvian fissure. No intraventricular hemorrhage identified. There is a 8 mm focus of intra-axial hyperdensity involving the right superior frontal gyrus which may represent a focal area of laminar necrosis, a small cortical mass, or a small cortical contusion. This is new since prior examination. No evidence of right lateral ventricular entrapment. Remaining ventricular size is normal. Cerebellum is unremarkable. Vascular: No hyperdense vessel or  unexpected calcification. Skull: Normal. Negative for fracture or focal lesion. Sinuses/Orbits: No acute finding. Other: Small left mastoid effusion. Middle ear cavities are clear. A moderate right frontotemporal scalp hematoma is noted IMPRESSION: 4.8 cm intraparenchymal hematoma within the left basal ganglia most compatible with a hemorrhagic infarct with moderate surrounding cytotoxic edema and resultant mass effect resulting in 5 mm left-to-right midline shift. 8 mm focus of hyperdensity within the right superior frontal gyrus possibly representing a small cortical contusion given the moderate right frontotemporal scalp hematoma. No associated calvarial fracture. No significant associated mass effect. This could be confirmed with MRI examination. These results were called by telephone at the time of interpretation on Nov 11, 2021 at 2:16 am to provider Dr. Rockne Coons, who verbally acknowledged these results. Electronically Signed   By: Fidela Salisbury M.D.   On: 11-11-2021 02:20   DG Chest Port 1 View  Result Date: 10/13/2021 CLINICAL DATA:  Altered level of consciousness, weakness, tachycardia EXAM: PORTABLE CHEST 1 VIEW COMPARISON:  02/05/2018 FINDINGS: Single frontal view of the chest demonstrates enlarged cardiomediastinal silhouette, likely accentuated by portable technique and supine positioning. No airspace disease, effusion, or pneumothorax. No acute bony abnormalities. IMPRESSION: 1. Enlarged cardiomediastinal silhouette likely accentuated by supine positioning and portable technique. 2. No acute airspace disease. Electronically Signed   By: Randa Ngo M.D.   On: 10/08/2021 22:29    ECG & Cardiac Imaging  ECG: poor quality due to significant baseline artifact, AF with RVR, rate 156, borderline rightward axis, IVCD, nonspecific ST-T wave changes - personally reviewed.  Assessment & Plan    Atrial fibrillation with RVR (CHADS2-VASc 3-4) IPH with midline shift requiring Xarelto reversal Acute  renal failure Acute encephalopathy, GCS 7 Elevated troponin due to AF with RVR and AKI History of heart failure with recovered EF, current euvolemic and well perfused.  History of atrial flutter s/p CTI ablation 2009 Non-obstructive CAD by 2008 coronary angiography Multiple co-morbidities, including HTN, morbid obesity, and OSA  - Continue amiodarone at 1mg /min for now - Discontinue diltiazem and hold carvedilol due to declining blood pressure and IPH with mass effect - Hold digoxin due to acute renal failure - Consider CTA PE protocol - would allow some degree of permissive tachycardia until PE ruled out - Xarelto reversed in ED, anticoagulation contraindicated due to IPH - Would hold diuretics for now and dose as needed to maintain even fluid balance - elevated risk for volume overload with HTS administration. - ED attending notified of acute mental status change - CPAP nightly when able  Signed, Marykay Lex, MD 11/13/2021, 2:55 AM  For questions or updates, please contact   Please consult [email or web address] for contact info under Cardiology/STEMI.

## 2021-10-29 NOTE — Progress Notes (Addendum)
Called in regards to this patients head CT  which shows an intraparenchymal hematoma in the left basal ganglia with mass effect and some midline shift. Per ED physician the patient is easily arousable and follows commands. At this point there is no neurosurgical intervention warranted. Neurology to manage. Formal consult to follow.

## 2021-10-29 NOTE — Progress Notes (Signed)
Time of death 1755 pronounced by this RN and Dorrene German

## 2021-10-29 NOTE — Plan of Care (Signed)
°  Interdisciplinary Goals of Care Family Meeting   Date carried out:: 2021/11/09  Location of the meeting: Bedside  Member's involved: Physician, Bedside Registered Nurse, and Family Member or next of kin  Durable Power of Attorney or acting medical decision maker: Sisters, specifically Eddie Davis    Discussion: We discussed goals of care for Eddie Davis .  I explained that his overall condition is worsening despite aggressive interventions to treat his stroke, respiratory failure and shock.  They stated that he would not want to live this way. They are awaiting the arrival of another sibling.  Code status: Full DNR  Disposition: Continue current acute care : will not escalate care.  Will withdraw care if no improvement in condition after sister arrives.  Continue current management.  Time spent for the meeting: 15 minutes  Roselie Awkward 09-Nov-2021, 11:06 AM

## 2021-10-29 NOTE — Assessment & Plan Note (Addendum)
-   The patient will be admitted to a stepdown unit bed. - We will follow neurochecks per ICH protocol. - Neurology consult was obtained by Dr. Theda Sers.  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 - I also notified neurosurgery as mentioned above. - We will contact trauma surgery as well. - Per neurology this is less likely an embolic bleeding infarction though it is in the differential diagnosis.

## 2021-10-29 NOTE — Assessment & Plan Note (Signed)
-   We will continue his antihypertensives and monitor BP per ICH protocol.

## 2021-10-29 NOTE — Assessment & Plan Note (Signed)
-   The patient has no current clear infectious etiology. - This could be certainly related to stress demargination especially given his ICH. - He was given a dose of IV Rocephin and Zithromax and I will hold off further antibiotics at this time. - She had 2 blood cultures drawn.

## 2021-10-29 DEATH — deceased

## 2021-11-01 LAB — CULTURE, BLOOD (ROUTINE X 2)
Culture: NO GROWTH
Culture: NO GROWTH
Special Requests: ADEQUATE

## 2021-11-29 NOTE — ED Provider Notes (Deleted)
[place] NEURO/TRAUMA/SURGICAL ICU Provider Note   CSN: [id number] Arrival date & time: 10/26/2021  2115     History  Chief Complaint  Patient presents with   Altered Mental Status    Eddie Davis is a 66 y.o. male.   Altered Mental Status     Home Medications Prior to Admission medications   Medication Sig Start Date End Date Taking? Authorizing Provider  allopurinol (ZYLOPRIM) 100 MG tablet Take 100 mg by mouth daily.    [provider]  atorvastatin (LIPITOR) 40 MG tablet Take 40 mg by mouth daily. 01/14/18   [provider]  carvedilol (COREG) 12.5 MG tablet Take 12.5 mg by mouth 2 (two) times daily with a meal.    [provider]  digoxin (LANOXIN) 0.25 MG tablet Take 0.25 mg by mouth daily.    [provider]  diltiazem (CARDIZEM CD) 120 MG 24 hr capsule Take 120 mg by mouth daily. 09/14/21   [provider]  Esomeprazole Magnesium (NEXIUM 24HR) 20 MG TBEC Take 20 mg by mouth daily.    [provider]  ferrous sulfate 325 (65 FE) MG tablet Take 1 tablet (325 mg total) by mouth daily. 02/05/18   Drenda Freeze, MD  JARDIANCE 25 MG TABS tablet Take 25 mg by mouth daily. 08/01/21   [provider]  metFORMIN (GLUCOPHAGE) 1000 MG tablet Take 1,000 mg by mouth 2 (two) times daily. 01/14/18   [provider]  olmesartan (BENICAR) 40 MG tablet Take 40 mg by mouth daily. 09/13/21   [provider]  ondansetron (ZOFRAN) 4 MG tablet Take 1 tablet (4 mg total) by mouth every 6 (six) hours. 01/24/20   Hayden Rasmussen, MD  Rivaroxaban (XARELTO) 20 MG TABS tablet Take 20 mg by mouth daily.     [provider]  silver sulfADIAZINE (SILVADENE) 1 % cream Apply 1 application topically 3 (three) times daily. After thoroughly washing face Patient not taking: Reported on 02/05/2018 02/13/15   Billy Fischer, MD  spironolactone (ALDACTONE) 25 MG tablet Take 25 mg by mouth daily.    [provider]   torsemide (DEMADEX) 20 MG tablet Take 20 mg by mouth daily as needed (for fluid or edema).  01/03/18   [provider]  traZODone (DESYREL) 50 MG tablet Take 25-50 mg by mouth at bedtime as needed for sleep.  12/31/17   [provider]      Allergies    Belviq [lorcaserin], Furosemide, and Hctz [hydrochlorothiazide]    Review of Systems   Review of Systems  Physical Exam Updated Vital Signs BP (!) 80/58    Pulse (!) 117    Temp 99.3 F (37.4 C) (Axillary)    Resp (!) 26    Ht '6\' 3"'$  (1.905 m)    Wt (!) 164.2 kg    SpO2 98%    BMI 45.25 kg/m  Physical Exam  ED Results / Procedures / Treatments   Labs (all labs ordered are listed, but only abnormal results are displayed) Labs Reviewed  CBC WITH DIFFERENTIAL/PLATELET - Abnormal; Notable for the following components:      Result Value   WBC 25.2 (*)    RBC 5.93 (*)    MCV 78.2 (*)    MCH 24.1 (*)    RDW 19.0 (*)    Neutro Abs 15.9 (*)    Lymphs Abs 8.1 (*)    Monocytes Absolute 1.3 (*)    All other components within normal  limits  COMPREHENSIVE METABOLIC PANEL - Abnormal; Notable for the following components:   CO2 21 (*)    Glucose, Bld 152 (*)    BUN 61 (*)    Creatinine, Ser 2.21 (*)    AST 64 (*)    GFR, Estimated 32 (*)    All other components within normal limits  LACTIC ACID, PLASMA - Abnormal; Notable for the following components:   Lactic Acid, Venous 2.3 (*)    All other components within normal limits  BASIC METABOLIC PANEL - Abnormal; Notable for the following components:   CO2 21 (*)    Glucose, Bld 171 (*)    BUN 67 (*)    Creatinine, Ser 2.37 (*)    GFR, Estimated 30 (*)    All other components within normal limits  CBC - Abnormal; Notable for the following components:   WBC 22.6 (*)    Hemoglobin 12.8 (*)    MCV 79.2 (*)    MCH 24.0 (*)    RDW 18.6 (*)    All other components within normal limits  HEPARIN LEVEL (UNFRACTIONATED) - Abnormal; Notable for the following components:    Heparin Unfractionated <0.10 (*)    All other components within normal limits  SODIUM - Abnormal; Notable for the following components:   Sodium 146 (*)    All other components within normal limits  LIPID PANEL - Abnormal; Notable for the following components:   Triglycerides 173 (*)    HDL 22 (*)    All other components within normal limits  HEMOGLOBIN A1C - Abnormal; Notable for the following components:   Hgb A1c MFr Bld 6.4 (*)    All other components within normal limits  CBG MONITORING, ED - Abnormal; Notable for the following components:   Glucose-Capillary 121 (*)    All other components within normal limits  I-STAT ARTERIAL BLOOD GAS, ED - Abnormal; Notable for the following components:   pCO2 arterial 30.3 (*)    pO2, Arterial 217 (*)    Bicarbonate 19.4 (*)    TCO2 20 (*)    Acid-base deficit 5.0 (*)    All other components within normal limits  I-STAT ARTERIAL BLOOD GAS, ED - Abnormal; Notable for the following components:   pH, Arterial 7.307 (*)    pO2, Arterial 179 (*)    Acid-base deficit 4.0 (*)    HCT 38.0 (*)    Hemoglobin 12.9 (*)    All other components within normal limits  POCT I-STAT 7, (LYTES, BLD GAS, ICA,H+H) - Abnormal; Notable for the following components:   pH, Arterial 7.291 (*)    Acid-base deficit 4.0 (*)    HCT 35.0 (*)    Hemoglobin 11.9 (*)    All other components within normal limits  TROPONIN I (HIGH SENSITIVITY) - Abnormal; Notable for the following components:   Troponin I (High Sensitivity) 95 (*)    All other components within normal limits  TROPONIN I (HIGH SENSITIVITY) - Abnormal; Notable for the following components:   Troponin I (High Sensitivity) 74 (*)    All other components within normal limits  CULTURE, BLOOD (ROUTINE X 2)  CULTURE, BLOOD (ROUTINE X 2)  RESP PANEL BY RT-PCR (FLU A&B, COVID) ARPGX2  MRSA NEXT GEN BY PCR, NASAL  LACTIC ACID, PLASMA  RAPID URINE DRUG SCREEN, HOSP PERFORMED  URINALYSIS, ROUTINE W REFLEX  MICROSCOPIC  ETHANOL  COOXEMETRY PANEL  BRAIN NATRIURETIC PEPTIDE  PATHOLOGIST SMEAR REVIEW  LACTIC ACID, PLASMA  HIV ANTIBODY (ROUTINE TESTING  W REFLEX)  APTT  PROTIME-INR  POC OCCULT BLOOD, ED    EKG EKG Interpretation  Date/Time:  Monday October 27 2021 21:20:13 EST Ventricular Rate:  156 PR Interval:  44 QRS Duration: 105 QT Interval:  260 QTC Calculation: 419 R Axis:   108 Text Interpretation: Atrial fibrillation with rapid ventricular response Right axis deviation Repolarization abnormality, prob rate related Confirmed by Blanchie Dessert [phone]) on Nov 27, 2021 8:22:40 AM  Radiology No results found.  Procedures Procedures    Medications Ordered in ED Medications  rocuronium bromide 100 MG/10ML SOSY (  Not Given Nov 27, 2021 1129)  etomidate (AMIDATE) 2 MG/ML injection (  Not Given 11-27-2021 1009)  diltiazem (CARDIZEM) 1 mg/mL load via infusion 10 mg (10 mg Intravenous Bolus from Bag 10/19/2021 2237)  cefTRIAXone (ROCEPHIN) 1 g in sodium chloride 0.9 % 100 mL IVPB (0 g Intravenous Stopped 10/24/2021 2332)  azithromycin (ZITHROMAX) 500 mg in sodium chloride 0.9 % 250 mL IVPB (0 mg Intravenous Stopped 10/03/2021 2355)  furosemide (LASIX) injection 40 mg (40 mg Intravenous Given 10/26/2021 2251)  magnesium sulfate IVPB 2 g 50 mL (0 g Intravenous Stopped 11-27-2021 0216)  amiodarone (NEXTERONE) IV bolus only 150 mg/100 mL (0 mg Intravenous Stopped 11/27/21 0138)  coag fact Xa recombinant (ANDEXXA) high dose infusion 1800 mg (0 mg Intravenous Stopped 11-27-2021 0707)  sodium chloride 3% (hypertonic) IV bolus 250 mL (0 mLs Intravenous Stopped 11-27-2021 0349)  sodium chloride 0.9 % bolus 500 mL (0 mLs Intravenous Stopped Nov 27, 2021 0651)  norepinephrine (LEVOPHED) 4-5 MG/250ML-% infusion SOLN (  Duplicate 3/50/09 3818)    ED Course/ Medical Decision Making/ A&P                           Medical Decision Making Amount and/or Complexity of Data Reviewed Labs: ordered. Radiology:  ordered.  Risk Prescription drug management. Decision regarding hospitalization.           Final Clinical Impression(s) / ED Diagnoses Final diagnoses:  Atrial fibrillation with RVR ([place])  Respiratory distress    Rx / DC Orders ED Discharge Orders          Ordered    Amb referral to AFIB Clinic        Nov 27, 2021 0215              Fransico Meadow, Hershal Coria 11/03/21 1119

## 2024-01-23 IMAGING — CT CT HEAD W/O CM
3 of 5 series · 14 of 47 positions shown, 16 images · non-contrast
Comparison: 02/05/2018

CLINICAL DATA: Altered mental status



[Series 5: head 3.0 mpr cor · coronal · 0.36mm/px · 3 of 68 slices shown]
[im 23/68  brain]
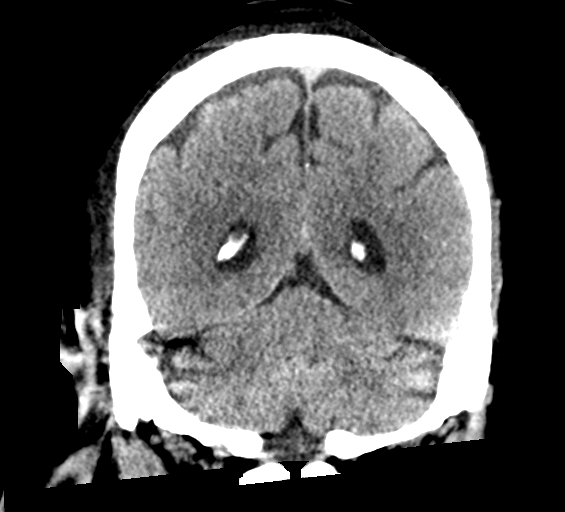
[im 30/68  brain]
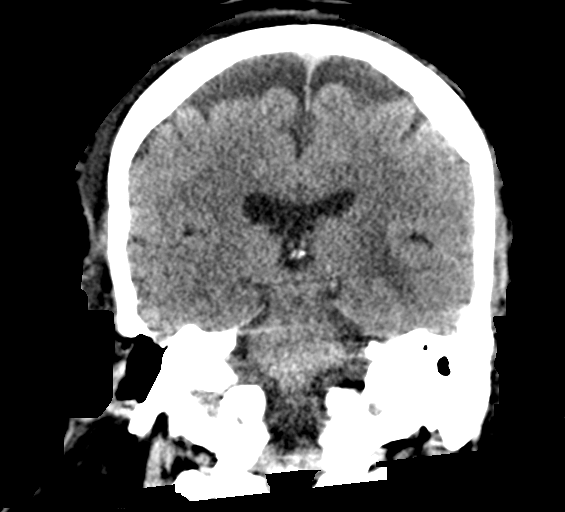
[im 38/68  brain]
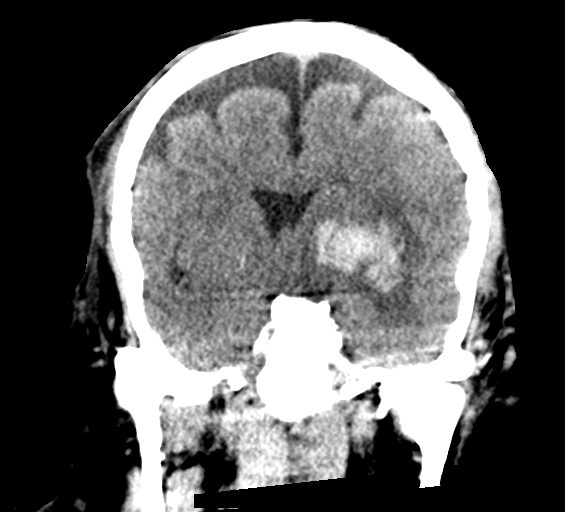

[Series 6: head 3.0 mpr sag · sagittal · 0.36mm/px · 3 of 68 slices shown]
[im 23/68  brain]
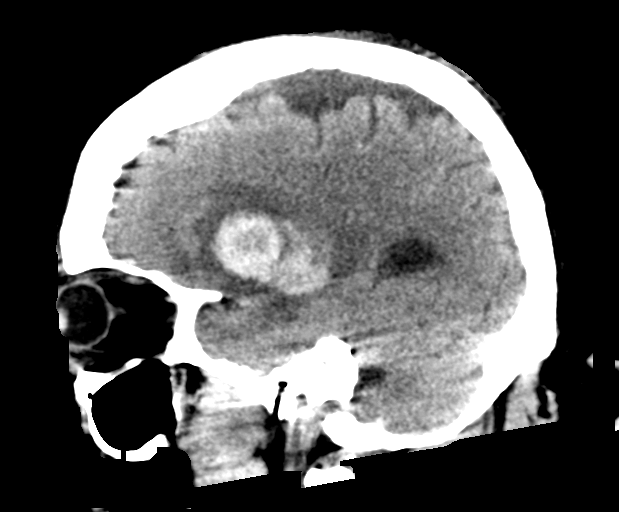
[im 34/68  brain]
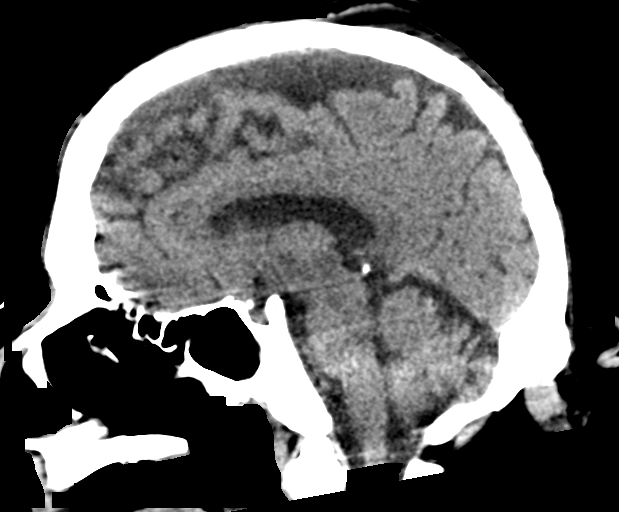
[im 45/68  brain]
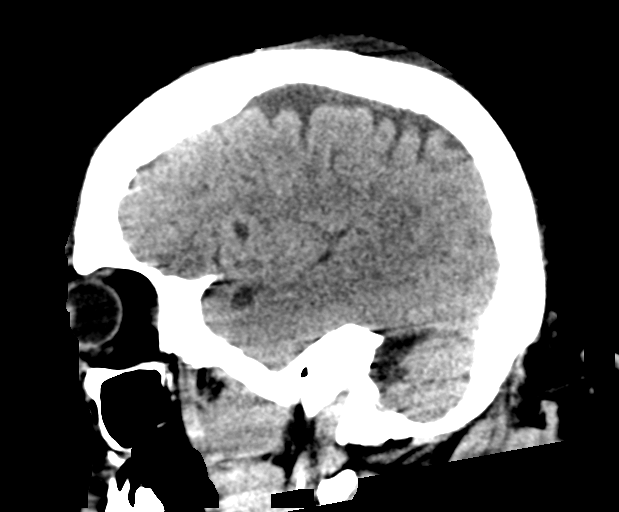

[Series 7: head 2.0 mpr true ax · axial · 0.39mm/px · z∈[+283,+433]mm · 8 of 92 slices shown, 10 images]
[im 6/92  brain]
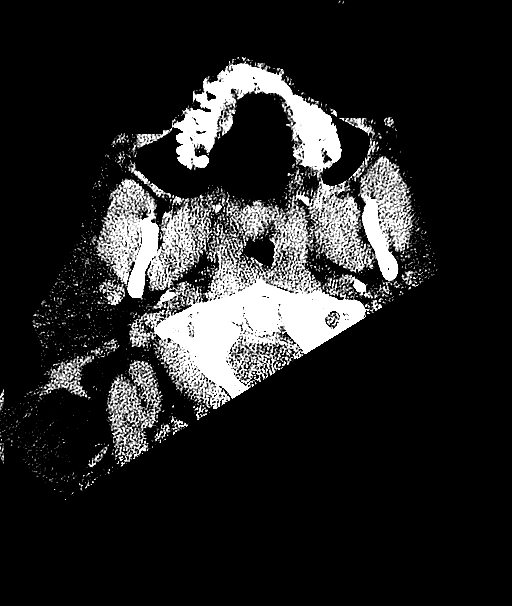
[im 6/92  bone]
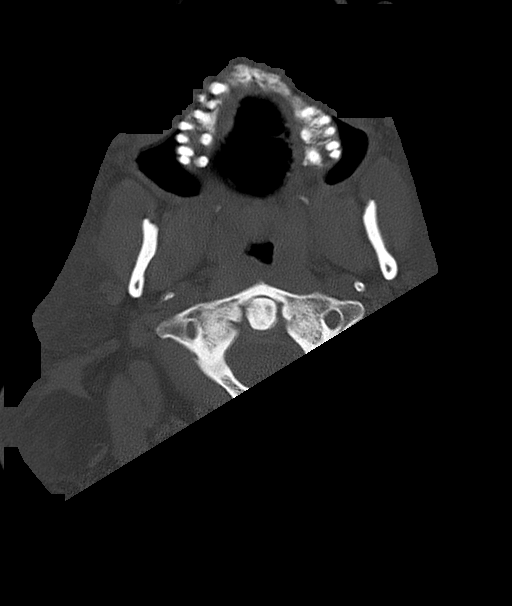
[im 18/92  brain]
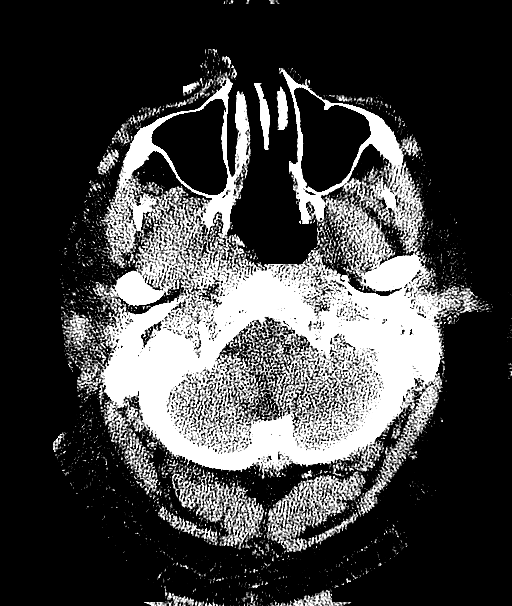
[im 29/92  brain]
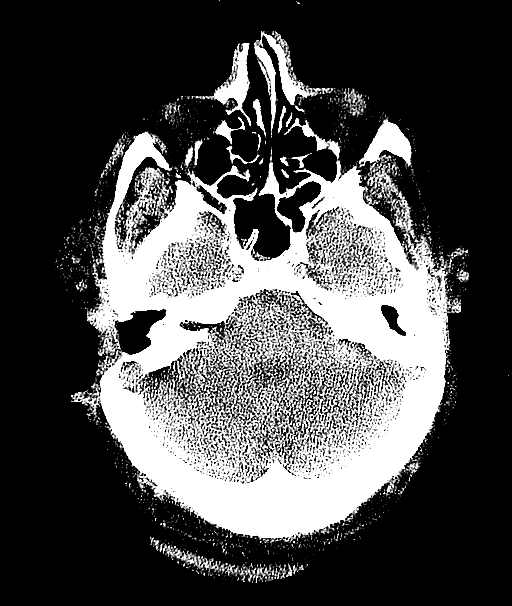
[im 40/92  brain]
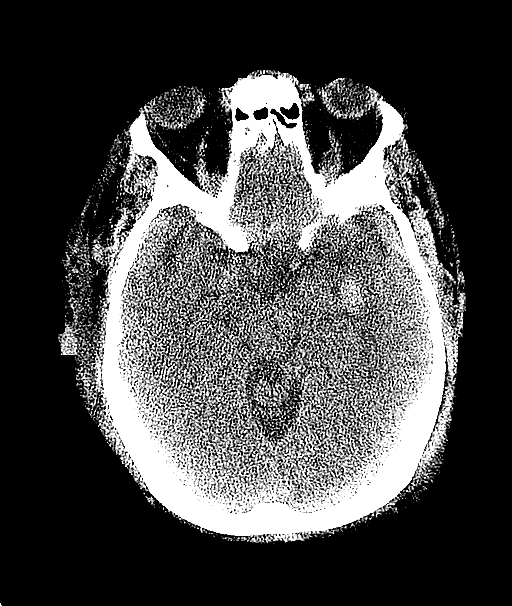
[im 52/92  brain]
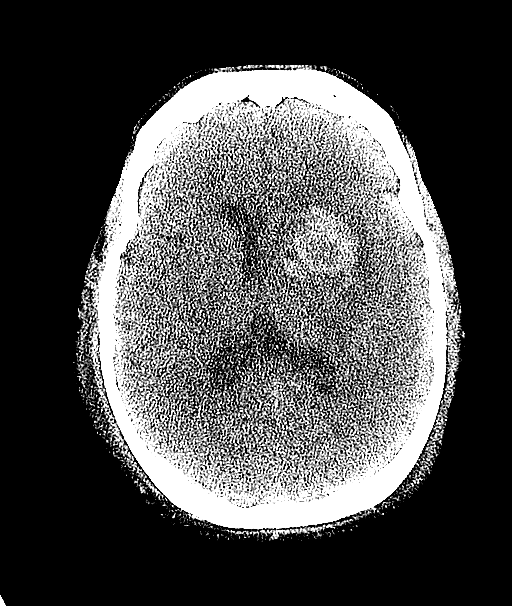
[im 52/92  bone]
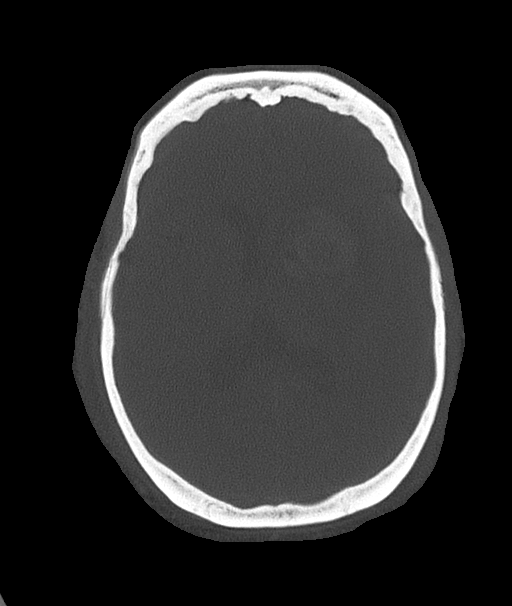
[im 63/92  brain]
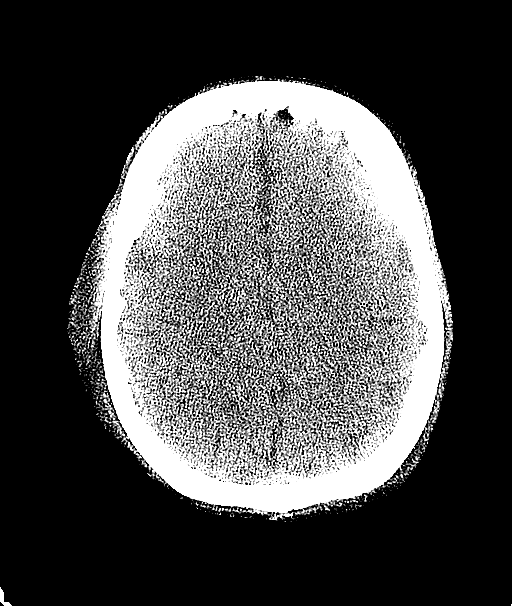
[im 74/92  brain]
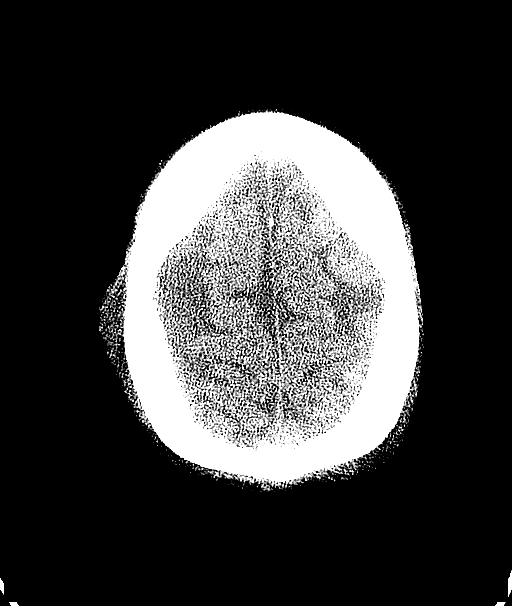
[im 86/92  brain]
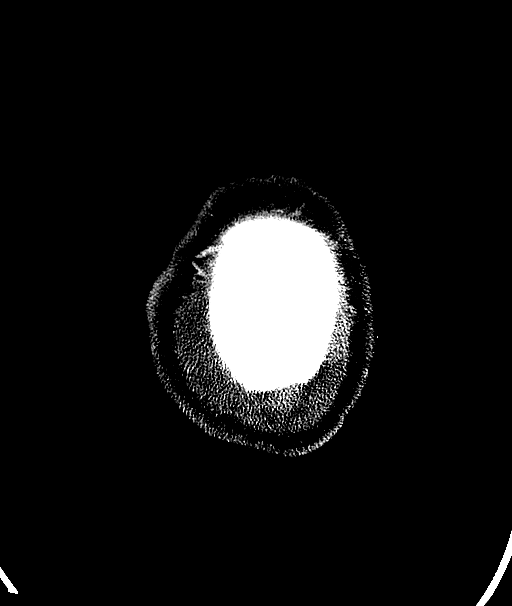

[14 of 47 positions shown; findings below may reference images not displayed]

FINDINGS: Brain: An intraparenchymal hematoma is seen within the left basal
ganglia extending into the anterior pole of the left temporal lobe
measuring 2.8 x 3.1 x 4.8 cm on axial image # [DATE] and coronal image
# [DATE] most in keeping with a hemorrhagic infarct. There is moderate
surrounding cytotoxic edema. Associated mass effect effaces the left
lateral ventricle and anterior third ventricle and results in
approximately 5 mm left-to-right midline shift. There is effacement
of the overlying sylvian fissure. No intraventricular hemorrhage
identified.

There is a 8 mm focus of intra-axial hyperdensity involving the
right superior frontal gyrus which may represent a focal area of
laminar necrosis, a small cortical mass, or a small cortical
contusion. This is new since prior examination.

No evidence of right lateral ventricular entrapment. Remaining
ventricular size is normal. Cerebellum is unremarkable.

Vascular: No hyperdense vessel or unexpected calcification.

Skull: Normal. Negative for fracture or focal lesion.

Sinuses/Orbits: No acute finding.

Other: Small left mastoid effusion. Middle ear cavities are clear. A
moderate right frontotemporal scalp hematoma is noted
IMPRESSION: 4.8 cm intraparenchymal hematoma within the left basal ganglia most
compatible with a hemorrhagic infarct with moderate surrounding
cytotoxic edema and resultant mass effect resulting in 5 mm
left-to-right midline shift.

8 mm focus of hyperdensity within the right superior frontal gyrus
possibly representing a small cortical contusion given the moderate
right frontotemporal scalp hematoma. No associated calvarial
fracture. No significant associated mass effect. This could be
confirmed with MRI examination.

These results were called by telephone at the time of interpretation
on 10/28/2021 at [DATE] to provider Dr. Ceejay, who verbally
acknowledged these results.

## 2024-01-23 IMAGING — CT CT HEAD W/O CM
3 of 4 series · 14 of 47 positions shown, 16 images · non-contrast
Comparison: Study at 9929 hours compared to 7465 hours

CLINICAL DATA: Mental status changes, head bleed



[Series 3: head 5.0 h30s · axial · 0.48mm/px · z∈[-177,-32]mm · 8 of 35 slices shown, 10 images]
[im 3/35  brain]
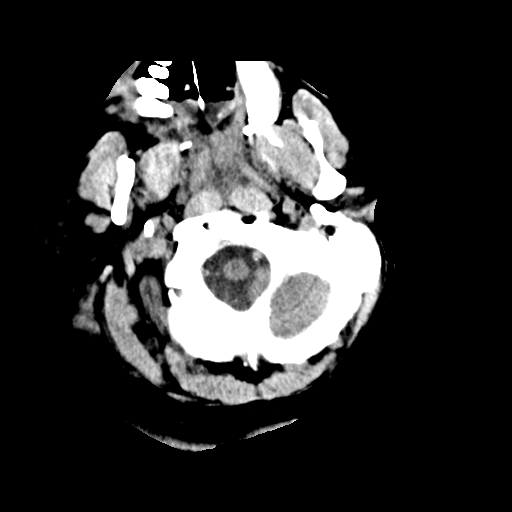
[im 3/35  bone]
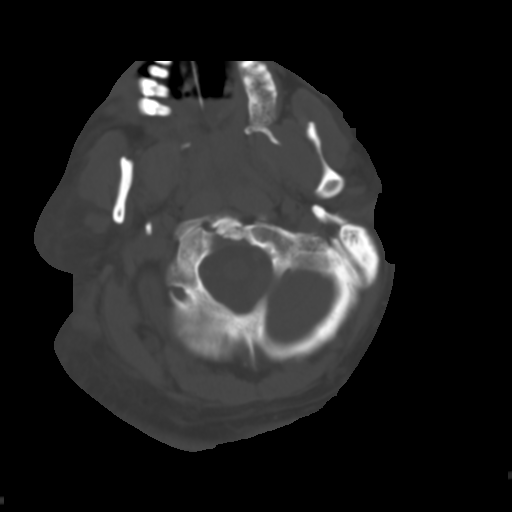
[im 8/35  brain]
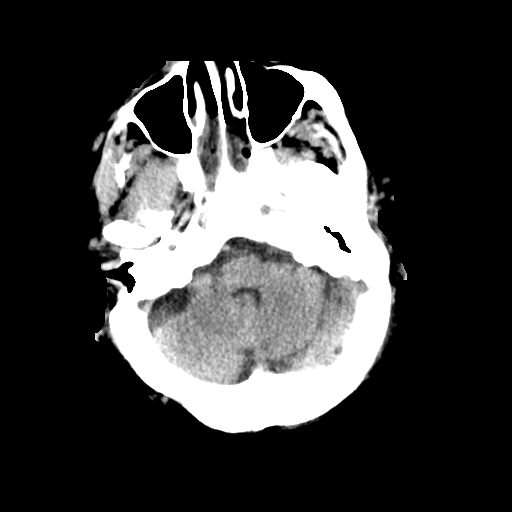
[im 11/35  brain]
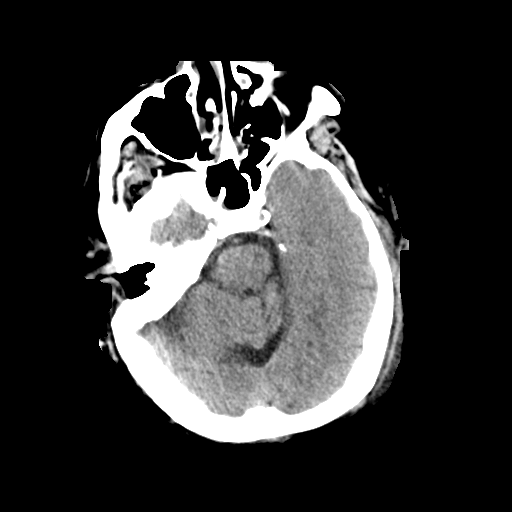
[im 16/35  brain]
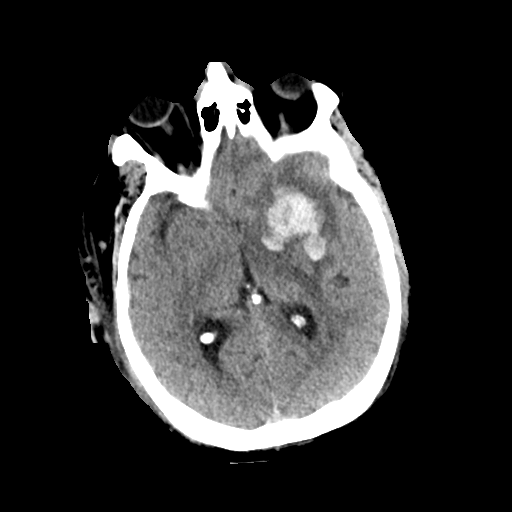
[im 19/35  brain]
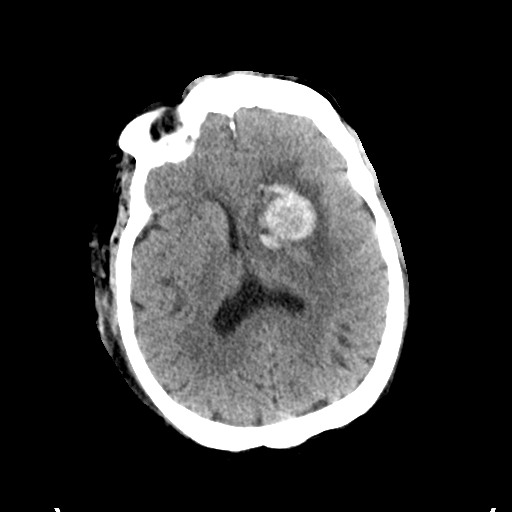
[im 19/35  bone]
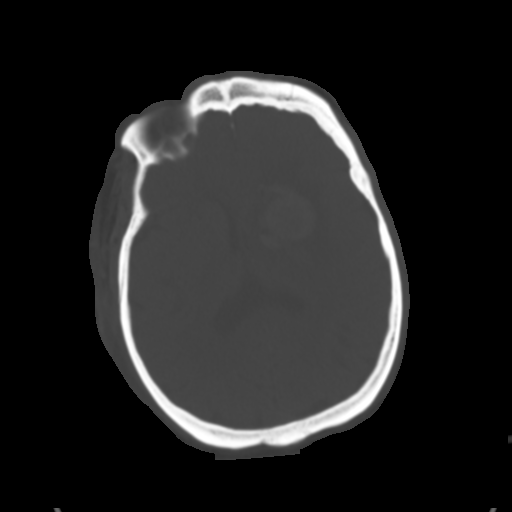
[im 24/35  brain]
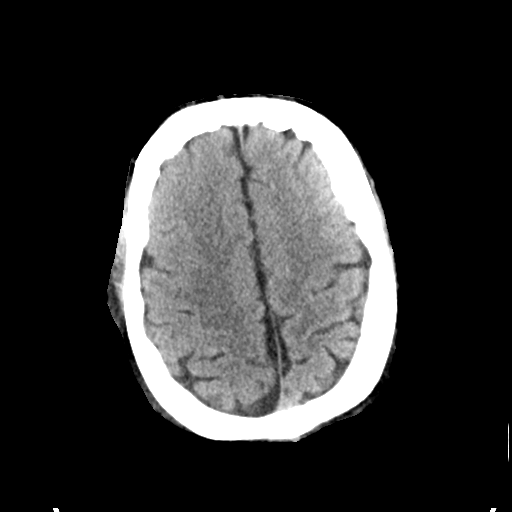
[im 27/35  brain]
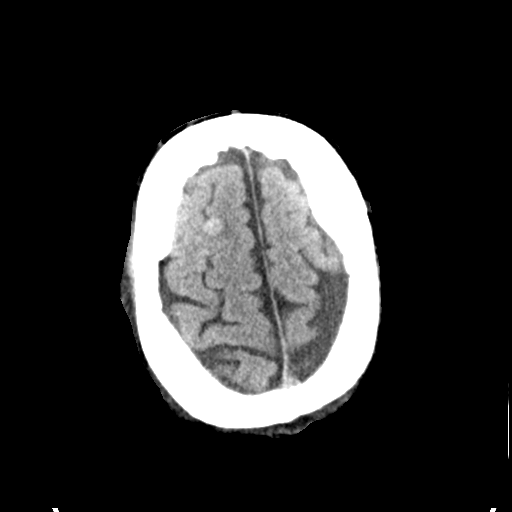
[im 32/35  brain]
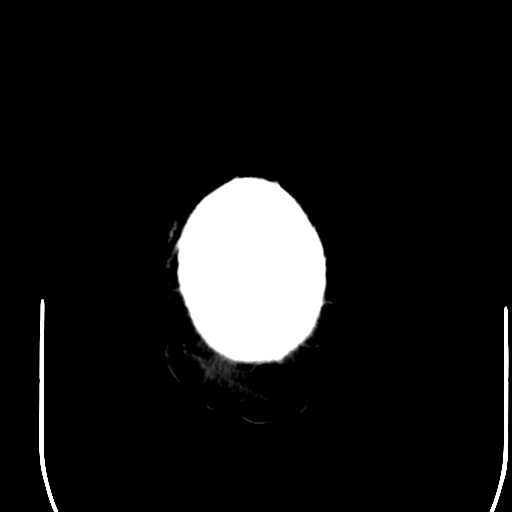

[Series 5: head 3.0 mpr cor · coronal · 0.33mm/px · 3 of 69 slices shown]
[im 15/69  brain]
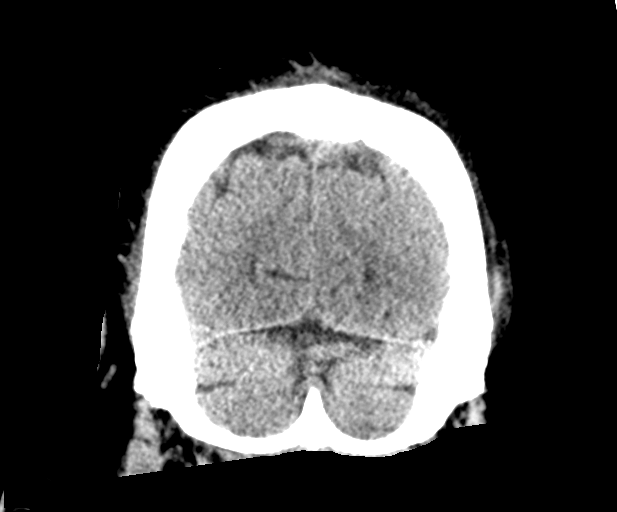
[im 28/69  brain]
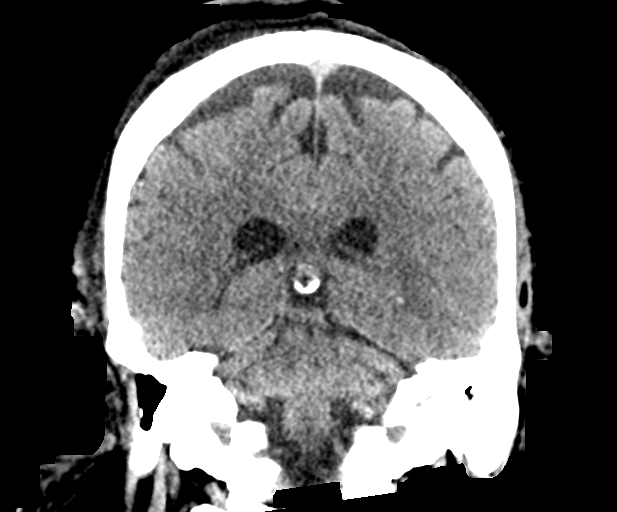
[im 40/69  brain]
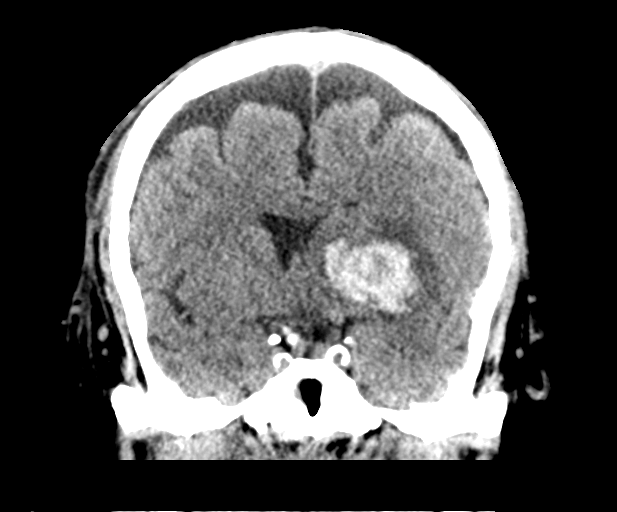

[Series 6: head 3.0 mpr sag · sagittal · 0.33mm/px · 3 of 60 slices shown]
[im 21/60  brain]
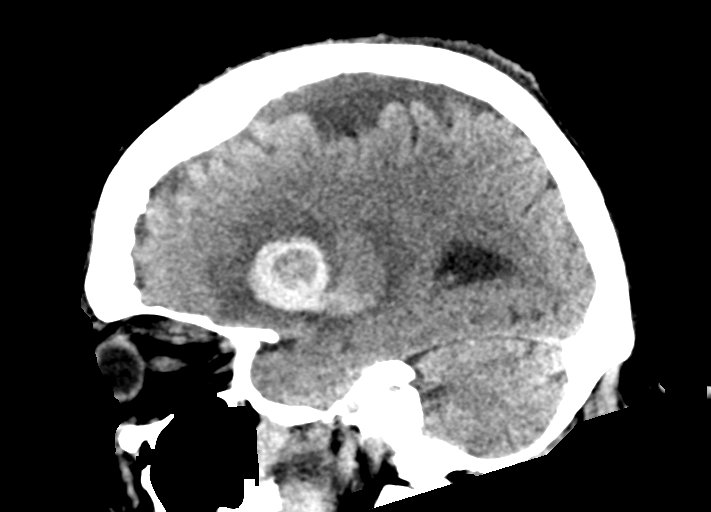
[im 30/60  brain]
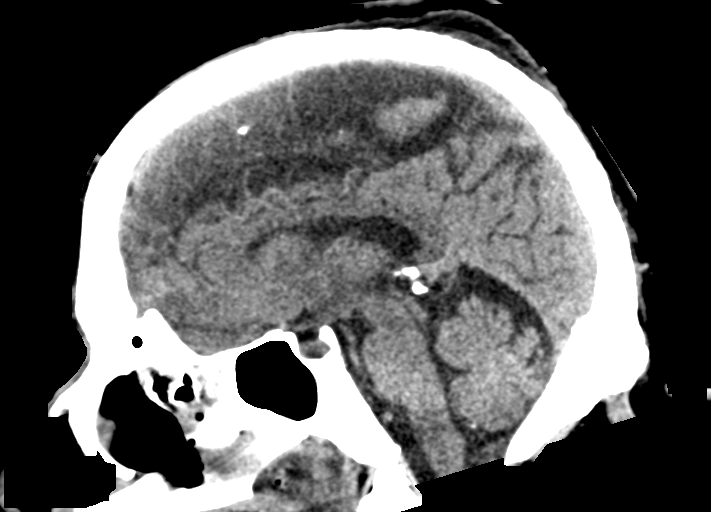
[im 39/60  brain]
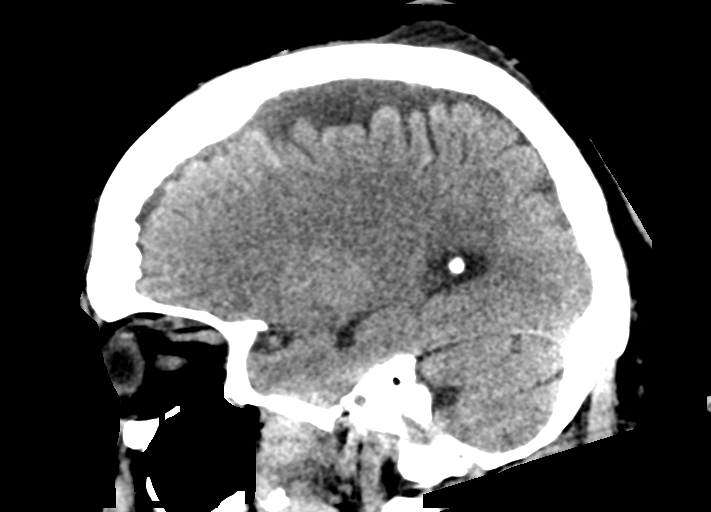

[14 of 47 positions shown; findings below may reference images not displayed]

FINDINGS: Brain: Generalized atrophy. Again identified large area of
intraparenchymal hemorrhage involving the LEFT basal ganglia 4.5 x
3.2 cm unchanged. Surrounding vasogenic edema. Extension of blood
into LEFT temporal lobe. 6 mm of LEFT-to-RIGHT midline shift.
Additional small high attenuation focus high RIGHT frontal lobe 9 x
8 mm image 26 consistent with blood. No additional mass, hemorrhage,
or infarct. No extra-axial fluid collections.

Vascular: Atherosclerotic calcification of internal carotid arteries
at skull base

Skull: Intact.  Fluid within posterior scalp.

Sinuses/Orbits: Mucosal thickening scattered ethmoid air cells and
sphenoid sinus

Change.

Other: N/A
IMPRESSION: Large area of intraparenchymal hemorrhage involving the LEFT basal
ganglia 4.5 x 3.2 cm with surrounding vasogenic edema and extension
into LEFT temporal lobe, stable versus earlier study.

6 mm of LEFT-to-RIGHT midline shift, previously 5 mm.

Additional small high attenuation focus high RIGHT frontal lobe 9 x
8 mm, may represent hemorrhagic contusion in the setting of trauma
or a hemorrhagic metastasis.

No significant interval
